# Patient Record
Sex: Female | Born: 1945 | Race: Black or African American | Hispanic: No | Marital: Single | Smoking: Never smoker
Health system: Southern US, Community
[De-identification: ages and names within clinical notes are randomized; demographics above are authoritative.]

## PROBLEM LIST (undated history)

## (undated) DIAGNOSIS — F5104 Psychophysiologic insomnia: Secondary | ICD-10-CM

## (undated) DIAGNOSIS — R946 Abnormal results of thyroid function studies: Secondary | ICD-10-CM

## (undated) DIAGNOSIS — E785 Hyperlipidemia, unspecified: Secondary | ICD-10-CM

## (undated) DIAGNOSIS — R7309 Other abnormal glucose: Secondary | ICD-10-CM

## (undated) DIAGNOSIS — E559 Vitamin D deficiency, unspecified: Secondary | ICD-10-CM

## (undated) DIAGNOSIS — Z862 Personal history of diseases of the blood and blood-forming organs and certain disorders involving the immune mechanism: Secondary | ICD-10-CM

## (undated) DIAGNOSIS — M810 Age-related osteoporosis without current pathological fracture: Secondary | ICD-10-CM

## (undated) DIAGNOSIS — G3184 Mild cognitive impairment, so stated: Secondary | ICD-10-CM

## (undated) HISTORY — DX: Personal history of diseases of the blood and blood-forming organs and certain disorders involving the immune mechanism: Z86.2

## (undated) HISTORY — DX: Hyperlipidemia, unspecified: E78.5

## (undated) HISTORY — DX: Other abnormal glucose: R73.09

## (undated) HISTORY — PX: SURGERY OF LIP: SUR1315

## (undated) HISTORY — DX: Psychophysiologic insomnia: F51.04

## (undated) HISTORY — DX: Mild cognitive impairment of uncertain or unknown etiology: G31.84

## (undated) HISTORY — DX: Age-related osteoporosis without current pathological fracture: M81.0

## (undated) HISTORY — DX: Vitamin D deficiency, unspecified: E55.9

## (undated) HISTORY — DX: Abnormal results of thyroid function studies: R94.6

---

## 2005-02-06 ENCOUNTER — Other Ambulatory Visit: Payer: Self-pay

## 2005-02-06 ENCOUNTER — Inpatient Hospital Stay: Payer: Self-pay | Admitting: Anesthesiology

## 2009-06-18 ENCOUNTER — Emergency Department: Payer: Self-pay | Admitting: Emergency Medicine

## 2009-07-20 DIAGNOSIS — Z862 Personal history of diseases of the blood and blood-forming organs and certain disorders involving the immune mechanism: Secondary | ICD-10-CM | POA: Insufficient documentation

## 2009-07-26 DIAGNOSIS — M810 Age-related osteoporosis without current pathological fracture: Secondary | ICD-10-CM | POA: Insufficient documentation

## 2009-08-28 ENCOUNTER — Ambulatory Visit: Payer: Self-pay | Admitting: Gastroenterology

## 2010-07-20 DIAGNOSIS — R946 Abnormal results of thyroid function studies: Secondary | ICD-10-CM | POA: Insufficient documentation

## 2010-07-20 DIAGNOSIS — E785 Hyperlipidemia, unspecified: Secondary | ICD-10-CM | POA: Insufficient documentation

## 2012-07-05 ENCOUNTER — Emergency Department: Payer: Self-pay | Admitting: Emergency Medicine

## 2012-07-05 LAB — URINALYSIS, COMPLETE
Bilirubin,UR: NEGATIVE
Glucose,UR: NEGATIVE mg/dL (ref 0–75)
Ketone: NEGATIVE
Leukocyte Esterase: NEGATIVE
Nitrite: NEGATIVE
Ph: 6 (ref 4.5–8.0)
Protein: NEGATIVE
RBC,UR: 1 /HPF (ref 0–5)
Specific Gravity: 1.004 (ref 1.003–1.030)
Squamous Epithelial: <1
WBC UR: 5 /HPF (ref 0–5)

## 2012-07-05 LAB — CBC
HCT: 40.1 % (ref 35.0–47.0)
HGB: 12.7 g/dL (ref 12.0–16.0)
MCH: 29.8 pg (ref 26.0–34.0)
MCHC: 31.8 g/dL — ABNORMAL LOW (ref 32.0–36.0)
MCV: 94 fL (ref 80–100)
Platelet: 258 10*3/uL (ref 150–440)
RBC: 4.27 10*6/uL (ref 3.80–5.20)
RDW: 13.2 % (ref 11.5–14.5)
WBC: 8.4 10*3/uL (ref 3.6–11.0)

## 2012-07-05 LAB — COMPREHENSIVE METABOLIC PANEL
Albumin: 3.8 g/dL (ref 3.4–5.0)
Alkaline Phosphatase: 75 U/L (ref 50–136)
Anion Gap: 8 (ref 7–16)
BUN: 9 mg/dL (ref 7–18)
Bilirubin,Total: 0.6 mg/dL (ref 0.2–1.0)
Calcium, Total: 9.1 mg/dL (ref 8.5–10.1)
Chloride: 108 mmol/L — ABNORMAL HIGH (ref 98–107)
Co2: 23 mmol/L (ref 21–32)
Creatinine: 0.75 mg/dL (ref 0.60–1.30)
EGFR (African American): >60
EGFR (Non-African Amer.): >60
Glucose: 93 mg/dL (ref 65–99)
Osmolality: 276 (ref 275–301)
Potassium: 3.8 mmol/L (ref 3.5–5.1)
SGOT(AST): 36 U/L (ref 15–37)
SGPT (ALT): 19 U/L
Sodium: 139 mmol/L (ref 136–145)
Total Protein: 8.6 g/dL — ABNORMAL HIGH (ref 6.4–8.2)

## 2012-07-05 LAB — LIPASE, BLOOD: Lipase: 76 U/L (ref 73–393)

## 2014-04-22 ENCOUNTER — Emergency Department: Payer: Self-pay | Admitting: Emergency Medicine

## 2014-04-22 LAB — PROTIME-INR
INR: 1
Prothrombin Time: 12.6 secs (ref 11.5–14.7)

## 2014-04-22 LAB — COMPREHENSIVE METABOLIC PANEL
Albumin: 3.1 g/dL — ABNORMAL LOW (ref 3.4–5.0)
Alkaline Phosphatase: 50 U/L
Anion Gap: 5 — ABNORMAL LOW (ref 7–16)
BUN: 11 mg/dL (ref 7–18)
Bilirubin,Total: 0.6 mg/dL (ref 0.2–1.0)
Calcium, Total: 7.5 mg/dL — ABNORMAL LOW (ref 8.5–10.1)
Chloride: 119 mmol/L — ABNORMAL HIGH (ref 98–107)
Co2: 20 mmol/L — ABNORMAL LOW (ref 21–32)
Creatinine: 0.84 mg/dL (ref 0.60–1.30)
EGFR (African American): >60
EGFR (Non-African Amer.): >60
Glucose: 85 mg/dL (ref 65–99)
Osmolality: 285 (ref 275–301)
Potassium: 3.2 mmol/L — ABNORMAL LOW (ref 3.5–5.1)
SGOT(AST): 32 U/L (ref 15–37)
SGPT (ALT): 13 U/L (ref 12–78)
Sodium: 144 mmol/L (ref 136–145)
Total Protein: 6.5 g/dL (ref 6.4–8.2)

## 2014-04-22 LAB — CBC
HCT: 40.9 % (ref 35.0–47.0)
HGB: 13.3 g/dL (ref 12.0–16.0)
MCH: 30.4 pg (ref 26.0–34.0)
MCHC: 32.6 g/dL (ref 32.0–36.0)
MCV: 93 fL (ref 80–100)
Platelet: 276 10*3/uL (ref 150–440)
RBC: 4.39 10*6/uL (ref 3.80–5.20)
RDW: 13.5 % (ref 11.5–14.5)
WBC: 13.4 10*3/uL — ABNORMAL HIGH (ref 3.6–11.0)

## 2014-04-22 LAB — CK TOTAL AND CKMB (NOT AT ARMC)
CK, Total: 154 U/L
CK-MB: 0.8 ng/mL (ref 0.5–3.6)

## 2014-04-22 LAB — TROPONIN I
Troponin-I: <0.02 ng/mL
Troponin-I: <0.02 ng/mL

## 2014-04-22 LAB — APTT: Activated PTT: 29.3 secs (ref 23.6–35.9)

## 2015-03-23 ENCOUNTER — Ambulatory Visit
Admit: 2015-03-23 | Disposition: A | Discharge status: Home / Self Care | Payer: Self-pay | Attending: Family Medicine | Admitting: Family Medicine

## 2015-03-23 DIAGNOSIS — M810 Age-related osteoporosis without current pathological fracture: Secondary | ICD-10-CM | POA: Diagnosis not present

## 2015-03-23 DIAGNOSIS — Z1231 Encounter for screening mammogram for malignant neoplasm of breast: Secondary | ICD-10-CM | POA: Diagnosis not present

## 2015-03-23 DIAGNOSIS — E2839 Other primary ovarian failure: Secondary | ICD-10-CM | POA: Diagnosis not present

## 2015-04-07 DIAGNOSIS — M810 Age-related osteoporosis without current pathological fracture: Secondary | ICD-10-CM | POA: Diagnosis not present

## 2015-04-07 DIAGNOSIS — M533 Sacrococcygeal disorders, not elsewhere classified: Secondary | ICD-10-CM | POA: Diagnosis not present

## 2015-06-09 ENCOUNTER — Ambulatory Visit: Payer: Self-pay | Admitting: Family Medicine

## 2015-07-24 DIAGNOSIS — M810 Age-related osteoporosis without current pathological fracture: Secondary | ICD-10-CM | POA: Diagnosis not present

## 2015-07-29 ENCOUNTER — Encounter: Payer: Self-pay | Admitting: Family Medicine

## 2015-07-29 DIAGNOSIS — M533 Sacrococcygeal disorders, not elsewhere classified: Secondary | ICD-10-CM | POA: Insufficient documentation

## 2015-07-31 ENCOUNTER — Encounter: Payer: Self-pay | Admitting: Family Medicine

## 2015-07-31 ENCOUNTER — Ambulatory Visit (INDEPENDENT_AMBULATORY_CARE_PROVIDER_SITE_OTHER): Payer: Medicare Other | Admitting: Family Medicine

## 2015-07-31 VITALS — BP 132/80 | HR 75 | Temp 98.9°F | Resp 14 | Ht 64.0 in | Wt 137.9 lb

## 2015-07-31 DIAGNOSIS — Z862 Personal history of diseases of the blood and blood-forming organs and certain disorders involving the immune mechanism: Secondary | ICD-10-CM

## 2015-07-31 DIAGNOSIS — Z79899 Other long term (current) drug therapy: Secondary | ICD-10-CM | POA: Diagnosis not present

## 2015-07-31 DIAGNOSIS — Z23 Encounter for immunization: Secondary | ICD-10-CM | POA: Diagnosis not present

## 2015-07-31 DIAGNOSIS — E785 Hyperlipidemia, unspecified: Secondary | ICD-10-CM

## 2015-07-31 DIAGNOSIS — M810 Age-related osteoporosis without current pathological fracture: Secondary | ICD-10-CM | POA: Diagnosis not present

## 2015-07-31 DIAGNOSIS — R946 Abnormal results of thyroid function studies: Secondary | ICD-10-CM | POA: Diagnosis not present

## 2015-07-31 MED ORDER — ZOSTER VACCINE LIVE 19400 UNT/0.65ML ~~LOC~~ SOLR: 0.6500 mL | Freq: Once | SUBCUTANEOUS | Status: DC

## 2015-07-31 NOTE — Progress Notes (Signed)
Name: Lauren Eaton   MRN: [id number]    DOB: 11/04/1946   Date:07/31/2015       Progress Note  Subjective  Chief Complaint  Chief Complaint  Patient presents with  . Osteoporosis    just started fosamax    HPI  Osteoporosis: seen by Dr. Gavin Potters last week, and was started on Fosamax, but TSH was elevated and needs to have more labs  Hyperlipidemia: on diet only, she avoids fried food, she exercise daily for an hour at [employer], on TV for senior citizens  Elevated TSH: she also had it elevated in the past, but fractions were normal, last one done by Dr. Gavin Potters was above 7, but she denies constipation, fatigue, dry skin, or hair loss.  Patient Active Problem List   Diagnosis Date Noted  . 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 Abnormal finding on thyroid function test 07/20/2010  . Dyslipidemia 07/20/2010  . OP (osteoporosis) 07/26/2009  . History of anemia 07/20/2009    History reviewed. No pertinent past surgical history.  History reviewed. No pertinent family history.  Social History   Social History  . Marital Status: Single    Spouse Name: N/A  . Number of Children: N/A  . Years of Education: N/A   Occupational History  . Not on file.   Social History Main Topics  . Smoking status: Never Smoker   . Smokeless tobacco: Never Used  . Alcohol Use: No  . Drug Use: No  . Sexual Activity: Not Currently   Other Topics Concern  . Not on file   Social History Narrative  . No narrative on file     Current outpatient prescriptions:  .  alendronate (FOSAMAX) 70 MG tablet, Take 1 tablet by mouth once a week., Disp: , Rfl: 0 .  calcium-vitamin D (SM CALCIUM 500/VITAMIN D3) 500-400 MG-UNIT per tablet, Take 1 tablet by mouth daily., Disp: , Rfl:  .  Multiple Vitamin (MULTI-VITAMINS) TABS, Take 1 tablet by mouth daily., Disp: , Rfl:   Not on File   ROS  Constitutional: Negative for fever mild  weight change.  Respiratory: Negative for cough and shortness of breath.    Cardiovascular: Negative for chest pain or palpitations.  Gastrointestinal: Negative for abdominal pain, no bowel changes.  Musculoskeletal: Negative for gait problem or joint swelling.  Skin: Negative for rash.  Neurological: Negative for dizziness or headache.  No other specific complaints in a complete review of systems (except as listed in HPI above).  Objective  Filed Vitals:   07/31/15 1613  BP: 132/80  Pulse: 75  Temp: 98.9 F (37.2 C)  TempSrc: Oral  Resp: 14  Height: 5\' 4"  (1.626 m)  Weight: 137 lb 14.4 oz (62.551 kg)  SpO2: 97%    Body mass index is 23.66 kg/(m^2).  Physical Exam  Constitutional: Patient appears well-developed and well-nourished.  No distress.  HEENT: head atraumatic, normocephalic, pupils equal and reactive to light,  neck supple, throat within normal limits Cardiovascular: Normal rate, regular rhythm and normal heart sounds.  No murmur heard. No BLE edema. Pulmonary/Chest: Effort normal and breath sounds normal. No respiratory distress. Abdominal: Soft.  There is no tenderness. Psychiatric: Patient has a normal mood and affect. behavior is normal. Judgment and thought content normal.    PHQ2/9: Depression screen PHQ 2/9 07/31/2015  Decreased Interest 0  Down, Depressed, Hopeless 0  PHQ - 2 Score 0    Fall Risk: Fall Risk  07/31/2015  Falls in the past year? No  Assessment & Plan  1. Dyslipidemia  - Lipid panel  2. OP (osteoporosis)  - Vit D  25 hydroxy (rtn osteoporosis monitoring)  3. History of anemia  - CBC with Differential/Platelet  4. Abnormal finding on thyroid function test  - Thyroid Panel With TSH  5. Long-term use of high-risk medication  - Comprehensive metabolic panel  6. Need for shingles vaccine  - Varicella-zoster vaccine subcutaneous  7. Need for pneumococcal vaccination  - Pneumococcal polysaccharide vaccine 23-valent greater than or equal to 2yo subcutaneous/IM  8. Needs flu shot  - Flu  Vaccine QUAD 36+ mos IM

## 2015-08-01 DIAGNOSIS — Z79899 Other long term (current) drug therapy: Secondary | ICD-10-CM | POA: Diagnosis not present

## 2015-08-01 DIAGNOSIS — E785 Hyperlipidemia, unspecified: Secondary | ICD-10-CM | POA: Diagnosis not present

## 2015-08-01 DIAGNOSIS — R946 Abnormal results of thyroid function studies: Secondary | ICD-10-CM | POA: Diagnosis not present

## 2015-08-01 DIAGNOSIS — M810 Age-related osteoporosis without current pathological fracture: Secondary | ICD-10-CM | POA: Diagnosis not present

## 2015-08-01 DIAGNOSIS — Z862 Personal history of diseases of the blood and blood-forming organs and certain disorders involving the immune mechanism: Secondary | ICD-10-CM | POA: Diagnosis not present

## 2015-08-02 LAB — CBC WITH DIFFERENTIAL/PLATELET
Basophils Absolute: 0 10*3/uL (ref 0.0–0.2)
Basos: 0 %
EOS (ABSOLUTE): 0.1 10*3/uL (ref 0.0–0.4)
Eos: 1 %
Hematocrit: 35.6 % (ref 34.0–46.6)
Hemoglobin: 11.9 g/dL (ref 11.1–15.9)
Immature Grans (Abs): 0 10*3/uL (ref 0.0–0.1)
Immature Granulocytes: 0 %
Lymphocytes Absolute: 1.7 10*3/uL (ref 0.7–3.1)
Lymphs: 20 %
MCH: 30.3 pg (ref 26.6–33.0)
MCHC: 33.4 g/dL (ref 31.5–35.7)
MCV: 91 fL (ref 79–97)
Monocytes Absolute: 0.5 10*3/uL (ref 0.1–0.9)
Monocytes: 6 %
Neutrophils Absolute: 6.1 10*3/uL (ref 1.4–7.0)
Neutrophils: 73 %
Platelets: 277 10*3/uL (ref 150–379)
RBC: 3.93 x10E6/uL (ref 3.77–5.28)
RDW: 14.2 % (ref 12.3–15.4)
WBC: 8.5 10*3/uL (ref 3.4–10.8)

## 2015-08-02 LAB — THYROID PANEL WITH TSH
Free Thyroxine Index: 1.9 (ref 1.2–4.9)
T4, Total: 7.4 ug/dL (ref 4.5–12.0)

## 2015-08-02 LAB — COMPREHENSIVE METABOLIC PANEL
ALT: 14 IU/L (ref 0–32)
AST: 20 IU/L (ref 0–40)
Albumin/Globulin Ratio: 1.6 (ref 1.1–2.5)
Albumin: 4.3 g/dL (ref 3.6–4.8)
Alkaline Phosphatase: 70 IU/L (ref 39–117)
BUN/Creatinine Ratio: 10 — ABNORMAL LOW (ref 11–26)
BUN: 8 mg/dL (ref 8–27)
Bilirubin Total: 0.6 mg/dL (ref 0.0–1.2)
CO2: 24 mmol/L (ref 18–29)
Calcium: 9.5 mg/dL (ref 8.7–10.3)
Chloride: 103 mmol/L (ref 97–108)
Creatinine, Ser: 0.79 mg/dL (ref 0.57–1.00)
GFR calc Af Amer: 90 mL/min/{1.73_m2} (ref >59)
GFR calc non Af Amer: 78 mL/min/{1.73_m2} (ref >59)
Globulin, Total: 2.7 g/dL (ref 1.5–4.5)
Glucose: 88 mg/dL (ref 65–99)
Potassium: 4.9 mmol/L (ref 3.5–5.2)
Sodium: 143 mmol/L (ref 134–144)
Total Protein: 7 g/dL (ref 6.0–8.5)

## 2015-08-02 LAB — LIPID PANEL
Chol/HDL Ratio: 5.3 ratio units — ABNORMAL HIGH (ref 0.0–4.4)
Cholesterol, Total: 208 mg/dL — ABNORMAL HIGH (ref 100–199)
HDL: 39 mg/dL — ABNORMAL LOW (ref >39)
LDL Calculated: 128 mg/dL — ABNORMAL HIGH (ref 0–99)
Triglycerides: 203 mg/dL — ABNORMAL HIGH (ref 0–149)
VLDL Cholesterol Cal: 41 mg/dL — ABNORMAL HIGH (ref 5–40)

## 2015-08-02 LAB — VITAMIN D 25 HYDROXY (VIT D DEFICIENCY, FRACTURES): Vit D, 25-Hydroxy: 33.8 ng/mL (ref 30.0–100.0)

## 2015-08-03 ENCOUNTER — Telehealth: Payer: Self-pay

## 2015-08-03 MED ORDER — ATORVASTATIN CALCIUM 40 MG PO TABS: 40.0000 mg | ORAL_TABLET | Freq: Every day | ORAL | Status: DC

## 2015-08-03 NOTE — Telephone Encounter (Signed)
-----   Message from [name], MD sent at 08/03/2015  2:03 PM EDT ----- Thyroid fractions within normal limits TSH is pending Lipid panel is not normal, and she would benefit from statin therapy - I can send prescription if she is willing to take medication Lipid panel shows low HDL : to improve HDL patient  needs to eat tree nuts ( pecans/pistachios/almonds ) four times weekly, eat fish two times weekly  and exercise  at least 150 minutes per week CBC is normal, WBC back to normal  Sugar , kidney and liver functions are within normal limits Vitamin D also within normal limits

## 2015-08-03 NOTE — Telephone Encounter (Signed)
Patient notified of labs and states she would start a statin therapy for her cholesterol. Please send medication into [facility] on [address]. Please.

## 2016-01-18 DIAGNOSIS — K1329 Other disturbances of oral epithelium, including tongue: Secondary | ICD-10-CM | POA: Diagnosis not present

## 2016-01-23 DIAGNOSIS — K1329 Other disturbances of oral epithelium, including tongue: Secondary | ICD-10-CM | POA: Diagnosis not present

## 2016-02-13 ENCOUNTER — Encounter: Payer: Self-pay | Admitting: Family Medicine

## 2016-02-13 ENCOUNTER — Ambulatory Visit (INDEPENDENT_AMBULATORY_CARE_PROVIDER_SITE_OTHER): Payer: Medicare Other | Admitting: Family Medicine

## 2016-02-13 VITALS — BP 138/82 | HR 75 | Temp 98.2°F | Resp 12 | Ht 64.0 in | Wt 138.9 lb

## 2016-02-13 DIAGNOSIS — Z1239 Encounter for other screening for malignant neoplasm of breast: Secondary | ICD-10-CM

## 2016-02-13 DIAGNOSIS — E785 Hyperlipidemia, unspecified: Secondary | ICD-10-CM

## 2016-02-13 DIAGNOSIS — Z0000 Encounter for general adult medical examination without abnormal findings: Secondary | ICD-10-CM

## 2016-02-13 DIAGNOSIS — Z124 Encounter for screening for malignant neoplasm of cervix: Secondary | ICD-10-CM

## 2016-02-13 DIAGNOSIS — M533 Sacrococcygeal disorders, not elsewhere classified: Secondary | ICD-10-CM | POA: Diagnosis not present

## 2016-02-13 DIAGNOSIS — M810 Age-related osteoporosis without current pathological fracture: Secondary | ICD-10-CM | POA: Diagnosis not present

## 2016-02-13 MED ORDER — ALENDRONATE SODIUM 70 MG PO TABS
70.0000 mg | ORAL_TABLET | ORAL | Status: DC
Start: 2016-02-13 — End: 2017-01-27

## 2016-02-13 MED ORDER — ATORVASTATIN CALCIUM 40 MG PO TABS: 40.0000 mg | ORAL_TABLET | Freq: Every day | ORAL | Status: DC

## 2016-02-13 MED ORDER — ASPIRIN EC 81 MG PO TBEC: 81.0000 mg | DELAYED_RELEASE_TABLET | Freq: Every day | ORAL | Status: AC

## 2016-02-13 NOTE — Progress Notes (Signed)
Name: Lauren Eaton   MRN: [id number]    DOB: [birth year]   Date:02/13/2016       Progress Note  Subjective  Chief Complaint  Chief Complaint  Patient presents with  . Annual Exam  . Orders    mammogram    HPI  Osteoporosis: seen by Dr. Gavin Potters n August 2016, and was started on Fosamax, but she states she did not get any refills and is out of medication. She took almost two years of Forteo back in 2012 and 2013. Last bone density was 03/2015 and still showed osteoporosis. She is taking otc supplementations. Last vitamin D and kidney function also TSH were normal in August 2016  Hyperlipidemia: on diet only, she avoids fried food, she exercise daily for an hour at [employer]  on TV for senior citizens- she will switch from peanuts to almonds/ or any other tree nut.  Coccygeal pain: she is doing much bette, she uses a soft pillow to sit on while watching TV.   Functional ability/safety issues: No Issues Hearing issues: Addressed  Activities of daily living: Discussed Home safety issues: No Issues  End Of Life Planning: Offered verbal information regarding advanced directives, healthcare power of attorney.  Preventative care, Health maintenance, Preventative health measures discussed.  Preventative screenings discussed today: lab work, colonoscopy, PAP, mammogram, DEXA.  Low Dose CT Chest recommended if Age 16-80 years, 30 pack-year currently smoking OR have quit w/in 15years.   Lifestyle risk factor issued reviewed: Diet, exercise, weight management, advised patient smoking is not healthy, nutrition/diet.  Preventative health measures discussed (5-10 year plan).  Reviewed and recommended vaccinations: - Pneumovax  - Prevnar  - Annual Influenza - Zostavax - Tdap   Depression screening: Done Fall risk screening: Done Discuss ADLs/IADLs: Done  Current medical providers: See HPI  Other health risk factors identified this visit: No other issues Cognitive impairment  issues: None identified  All above discussed with patient. Appropriate education, counseling and referral will be made based upon the above.   Patient Active Problem List   Diagnosis Date Noted  . 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 Dyslipidemia 07/20/2010  . OP (osteoporosis) 07/26/2009  . History of anemia 07/20/2009    Past Surgical History  Procedure Laterality Date  . Surgery of lip      Family History  Problem Relation Age of Onset  . Alzheimer's disease Mother   . Dementia Mother   . Early death Father     MVA  . CVA Sister 60    complications of  . Cancer Brother     stomach  . Cancer Brother     lung    Social History   Social History  . Marital Status: Single    Spouse Name: N/A  . Number of Children: 1  . Years of Education: N/A   Occupational History  . 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 Smoking status: Never Smoker   . Smokeless tobacco: Never Used     Comment: Used snuff, dips 3-4 dips/day, has used it 40-50 years. quit 10/2014.  Marland Kitchen Alcohol Use: No  . Drug Use: No  . Sexual Activity: Not Currently   Other Topics Concern  . Not on file   Social History Narrative     Current outpatient prescriptions:  .  atorvastatin (LIPITOR) 40 MG tablet, Take 1 tablet (40 mg total) by mouth daily., Disp: 90 tablet, Rfl: 1 .  calcium-vitamin D (SM CALCIUM  500/VITAMIN D3) 500-400 MG-UNIT per tablet, Take 1 tablet by mouth daily., Disp: , Rfl:  .  Multiple Vitamin (MULTI-VITAMINS) TABS, Take 1 tablet by mouth daily., Disp: , Rfl:  .  acetaminophen-codeine (TYLENOL #3) 300-30 MG tablet, Reported on 02/13/2016, Disp: , Rfl: 0 .  alendronate (FOSAMAX) 70 MG tablet, Take 1 tablet by mouth once a week. Reported on 02/13/2016, Disp: , Rfl: 0  No Known Allergies   ROS  Constitutional: Negative for fever or weight change.  Respiratory: Negative for cough and shortness of breath.   Cardiovascular: Negative for chest pain or  palpitations.  Gastrointestinal: Negative for abdominal pain, no bowel changes.  Musculoskeletal: Negative for gait problem or joint swelling.  Skin: Negative for rash.  Neurological: Negative for dizziness or headache.  No other specific complaints in a complete review of systems (except as listed in HPI above).   Objective  Filed Vitals:   02/13/16 1359  BP: 138/82  Pulse: 75  Temp: 98.2 F (36.8 C)  TempSrc: Oral  Resp: 12  Height:  (1.626 m)  Weight: 138 lb 14.4 oz (63.005 kg)  SpO2: 98%    Body mass index is 23.83 kg/(m^2).  Physical Exam  Constitutional: Patient appears well-developed and well-nourished. No distress.  HENT: Head: Normocephalic and atraumatic. Ears: B TMs ok, no erythema or effusion; Nose: Nose normal. Mouth/Throat: Oropharynx is clear and moist. No oropharyngeal exudate.  Eyes: Conjunctivae and EOM are normal. Pupils are equal, round, and reactive to light. No scleral icterus.  Neck: Normal range of motion. Neck supple. No JVD present. No thyromegaly present.  Cardiovascular: Normal rate, regular rhythm and normal heart sounds.  No murmur heard. No BLE edema. Pulmonary/Chest: Effort normal and breath sounds normal. No respiratory distress. Abdominal: Soft. Bowel sounds are normal, no distension. There is no tenderness. no masses Breast: no lumps or masses, no nipple discharge or rashes FEMALE GENITALIA:  External genitalia normal External urethra normal Vaginal vault normal without discharge or lesions Cervix normal without discharge or lesions Bimanual exam normal without masses RECTAL: not done Musculoskeletal: Normal range of motion, no joint effusions. No gross deformities Neurological: he is alert and oriented to person, place, and time. No cranial nerve deficit. Coordination, balance, strength, speech and gait are normal.  Skin: Skin is warm and dry. No rash noted. No erythema.  Psychiatric: Patient has a normal mood and affect. behavior  is normal. Judgment and thought content normal.  PHQ2/9: Depression screen [facility] 2/9 02/13/2016 07/31/2015  Decreased Interest 0 0  Down, Depressed, Hopeless 0 0  PHQ - 2 Score 0 0    Fall Risk: Fall Risk  02/13/2016 07/31/2015  Falls in the past year? No No    Functional Status Survey: Is the patient deaf or have difficulty hearing?: No Does the patient have difficulty seeing, even when wearing glasses/contacts?: No Does the patient have difficulty concentrating, remembering, or making decisions?: No Does the patient have difficulty walking or climbing stairs?: No Does the patient have difficulty dressing or bathing?: No Does the patient have difficulty doing errands alone such as visiting a doctor's office or shopping?: No    Assessment & Plan  1. Medicare annual wellness visit, subsequent  Discussed importance of 150 minutes of physical activity weekly, eat two servings of fish weekly, eat one serving of tree nuts ( cashews, pistachios, pecans, almonds.Marland Kitchen) every other day, eat 6 servings of fruit/vegetables daily and drink plenty of water and avoid sweet beverages.   2. Dyslipidemia  Discussed  ways to increase HDL cholesterol, continue statin therapy, recheck labs in 6 months - aspirin EC 81 MG tablet; Take 1 tablet (81 mg total) by mouth daily.  Dispense: 30 tablet; Refill: 0 - atorvastatin (LIPITOR) 40 MG tablet; Take 1 tablet (40 mg total) by mouth daily.  Dispense: 90 tablet; Refill: 1   3. OP (osteoporosis)  Needs to resume Fosamax - alendronate (FOSAMAX) 70 MG tablet; Take 1 tablet (70 mg total) by mouth once a week. Reported on 02/13/2016  Dispense: 4 tablet; Refill: 12  4. Coccyalgia  Improved.   5. Breast cancer screening  - MM Digital Screening; Future  6. Cervical cancer screening  - Pap IG and HPV (high risk) DNA detection

## 2016-02-14 DIAGNOSIS — Z124 Encounter for screening for malignant neoplasm of cervix: Secondary | ICD-10-CM | POA: Diagnosis not present

## 2016-02-14 DIAGNOSIS — Z1151 Encounter for screening for human papillomavirus (HPV): Secondary | ICD-10-CM | POA: Diagnosis not present

## 2016-02-17 LAB — PAP IG AND HPV HIGH-RISK
HPV, high-risk: NEGATIVE
PAP Smear Comment: 0

## 2016-07-24 ENCOUNTER — Other Ambulatory Visit: Payer: Self-pay | Admitting: Pharmacist

## 2016-07-24 NOTE — Patient Outreach (Signed)
Outreach call to Hess CorporationMaxine Eaton regarding her request for follow up from the [facility]. Left a HIPAA compliant message on the patient's voicemail.  Duanne MoronElisabeth Shevelle Smither, PharmD Clinical Pharmacist [facility] [phone]

## 2016-08-09 ENCOUNTER — Other Ambulatory Visit: Payer: Self-pay | Admitting: Family Medicine

## 2016-08-09 ENCOUNTER — Encounter: Payer: Self-pay | Admitting: Family Medicine

## 2016-08-09 ENCOUNTER — Ambulatory Visit (INDEPENDENT_AMBULATORY_CARE_PROVIDER_SITE_OTHER): Payer: Medicare Other | Admitting: Family Medicine

## 2016-08-09 VITALS — BP 118/74 | HR 78 | Temp 98.4°F | Resp 16 | Ht 64.0 in | Wt 138.2 lb

## 2016-08-09 DIAGNOSIS — Z862 Personal history of diseases of the blood and blood-forming organs and certain disorders involving the immune mechanism: Secondary | ICD-10-CM | POA: Diagnosis not present

## 2016-08-09 DIAGNOSIS — R4689 Other symptoms and signs involving appearance and behavior: Secondary | ICD-10-CM | POA: Insufficient documentation

## 2016-08-09 DIAGNOSIS — M810 Age-related osteoporosis without current pathological fracture: Secondary | ICD-10-CM

## 2016-08-09 DIAGNOSIS — E785 Hyperlipidemia, unspecified: Secondary | ICD-10-CM | POA: Diagnosis not present

## 2016-08-09 DIAGNOSIS — R413 Other amnesia: Secondary | ICD-10-CM | POA: Insufficient documentation

## 2016-08-09 DIAGNOSIS — G3184 Mild cognitive impairment, so stated: Secondary | ICD-10-CM | POA: Diagnosis not present

## 2016-08-09 DIAGNOSIS — Z79899 Other long term (current) drug therapy: Secondary | ICD-10-CM

## 2016-08-09 DIAGNOSIS — F919 Conduct disorder, unspecified: Secondary | ICD-10-CM | POA: Diagnosis not present

## 2016-08-09 LAB — CBC WITH DIFFERENTIAL/PLATELET
Basophils Absolute: 0 cells/uL (ref 0–200)
Basophils Relative: 0 %
Eosinophils Absolute: 130 cells/uL (ref 15–500)
Eosinophils Relative: 2 %
HCT: 37.7 % (ref 35.0–45.0)
Hemoglobin: 12.6 g/dL (ref 11.7–15.5)
Lymphocytes Relative: 36 %
Lymphs Abs: 2340 cells/uL (ref 850–3900)
MCH: 30.4 pg (ref 27.0–33.0)
MCHC: 33.4 g/dL (ref 32.0–36.0)
MCV: 90.8 fL (ref 80.0–100.0)
MPV: 9.2 fL (ref 7.5–12.5)
Monocytes Absolute: 390 cells/uL (ref 200–950)
Monocytes Relative: 6 %
Neutro Abs: 3640 cells/uL (ref 1500–7800)
Neutrophils Relative %: 56 %
Platelets: 266 10*3/uL (ref 140–400)
RBC: 4.15 MIL/uL (ref 3.80–5.10)
RDW: 13.4 % (ref 11.0–15.0)
WBC: 6.5 10*3/uL (ref 3.8–10.8)

## 2016-08-09 LAB — LIPID PANEL
Cholesterol: 114 mg/dL — ABNORMAL LOW (ref 125–200)
HDL: 56 mg/dL (ref >=46)
LDL Cholesterol: 43 mg/dL (ref <130)
Total CHOL/HDL Ratio: 2 Ratio (ref <=5.0)
Triglycerides: 74 mg/dL (ref <150)
VLDL: 15 mg/dL (ref <30)

## 2016-08-09 LAB — COMPLETE METABOLIC PANEL WITH GFR
ALT: 24 U/L (ref 6–29)
AST: 35 U/L (ref 10–35)
Albumin: 4.4 g/dL (ref 3.6–5.1)
Alkaline Phosphatase: 113 U/L (ref 33–130)
BUN: 9 mg/dL (ref 7–25)
CO2: 28 mmol/L (ref 20–31)
Calcium: 9.7 mg/dL (ref 8.6–10.4)
Chloride: 105 mmol/L (ref 98–110)
Creat: 0.84 mg/dL (ref 0.50–0.99)
GFR, Est African American: 83 mL/min (ref >=60)
GFR, Est Non African American: 72 mL/min (ref >=60)
Glucose, Bld: 76 mg/dL (ref 65–99)
Potassium: 4.4 mmol/L (ref 3.5–5.3)
Sodium: 142 mmol/L (ref 135–146)
Total Bilirubin: 0.9 mg/dL (ref 0.2–1.2)
Total Protein: 7.6 g/dL (ref 6.1–8.1)

## 2016-08-09 LAB — TSH: TSH: 6.18 mIU/L — ABNORMAL HIGH

## 2016-08-09 LAB — VITAMIN B12: Vitamin B-12: 735 pg/mL (ref 200–1100)

## 2016-08-09 MED ORDER — ATORVASTATIN CALCIUM 40 MG PO TABS: 40.0000 mg | ORAL_TABLET | Freq: Every day | ORAL | 1 refills | Status: DC

## 2016-08-09 MED ORDER — DONEPEZIL HCL 5 MG PO TABS: 5.0000 mg | ORAL_TABLET | Freq: Every day | ORAL | 0 refills | Status: DC

## 2016-08-09 NOTE — Progress Notes (Signed)
Name: Lauren Eaton   MRN: [id number]    DOB: Mar 05, 1946   Date:08/09/2016       Progress Note  Subjective  Chief Complaint  Chief Complaint  Patient presents with  . Memory Loss    Patient daughter is concern about mother's memory is fading, Onset- the past year and getting worst.     HPI  Osteoporosis: seen by Dr. Gavin Potters n August 2016, and was started on Fosamax, she had a gap, but has been taking it weekly since Feb 2017. She took almost two years of Forteo back in 2012 and 2013. Last bone density was 03/2015 and still showed osteoporosis. She is taking otc supplementations. Last vitamin D and kidney function also TSH were normal in August 2016  Hyperlipidemia: on stating therapy,  she avoids fried food, she exercise daily for an hour at [employer]  on TV for senior citizens- she has not switched from peanuts to tree nuts. She is due for labs  Memory loss/change in behavior: daughter came in with her today and states she has been forgetful ( asking same question) but not re-telling stories. Getting paranoid about her great-great-great- grandaughter. She is very worried about baby being cold, she is calling relatives almost daily because she is worried. She is concerned about the baby. She has never gotten lost. MMS today was 28. Positive family history of Alzheimer's - mother, also has a sister with symptoms but not formally diagnosed with dementia.  Patient Active Problem List   Diagnosis Date Noted  . Behavioral change 08/09/2016  . Memory changes 08/09/2016  . 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 Dyslipidemia 07/20/2010  . OP (osteoporosis) 07/26/2009  . History of anemia 07/20/2009    Past Surgical History:  Procedure Laterality Date  . SURGERY OF LIP      Family History  Problem Relation Age of Onset  . Alzheimer's disease Mother   . Dementia Mother   . Early death Father     MVA  . CVA Sister 16    complications of  . Cancer Brother     stomach  . Cancer Brother    lung    Social History   Social History  . Marital status: Single    Spouse name: N/A  . Number of children: 1  . Years of education: N/A   Occupational History  . 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 Smoking status: Never Smoker  . Smokeless tobacco: Never Used     Comment: Used snuff, dips 3-4 dips/day, has used it 40-50 years. quit 10/2014.  Marland Kitchen Alcohol use No  . Drug use: No  . Sexual activity: Not Currently   Other Topics Concern  . Not on file   Social History Narrative   Lives alone in a senior citizen home, has friend and likes to take care of her plants and go out with her friend/neighbors. Still able to drive - but only grocery stores and church.      Current Outpatient Prescriptions:  .  alendronate (FOSAMAX) 70 MG tablet, Take 1 tablet (70 mg total) by mouth once a week. Reported on 02/13/2016, Disp: 4 tablet, Rfl: 12 .  aspirin EC 81 MG tablet, Take 1 tablet (81 mg total) by mouth daily., Disp: 30 tablet, Rfl: 0 .  atorvastatin (LIPITOR) 40 MG tablet, Take 1 tablet (40 mg total) by mouth daily., Disp: 90 tablet, Rfl: 1 .  calcium-vitamin D (SM CALCIUM 500/VITAMIN D3)  500-400 MG-UNIT per tablet, Take 1 tablet by mouth daily., Disp: , Rfl:  .  Multiple Vitamin (MULTI-VITAMINS) TABS, Take 1 tablet by mouth daily., Disp: , Rfl:   No Known Allergies   ROS  Constitutional: Negative for fever or weight change.  Respiratory: Negative for cough and shortness of breath.   Cardiovascular: Negative for chest pain or palpitations.  Gastrointestinal: Negative for abdominal pain, no bowel changes.  Musculoskeletal: Negative for gait problem or joint swelling.  Skin: Negative for rash.  Neurological: Negative for dizziness or headache.  No other specific complaints in a complete review of systems (except as listed in HPI above).  Objective  Vitals:   08/09/16 0933  BP: 118/74  Pulse: 78  Resp: 16  Temp: 98.4 F (36.9 C)   TempSrc: Oral  SpO2: 96%  Weight: 138 lb 3.2 oz (62.7 kg)  Height: 5\' 4"  (1.626 m)    Body mass index is 23.72 kg/m.  Physical Exam  Constitutional: Patient appears well-developed and well-nourished. Obese  No distress.  HEENT: head atraumatic, normocephalic, pupils equal and reactive to light, neck supple, throat within normal limits Cardiovascular: Normal rate, regular rhythm and normal heart sounds.  No murmur heard. No BLE edema. Pulmonary/Chest: Effort normal and breath sounds normal. No respiratory distress. Abdominal: Soft.  There is no tenderness. Psychiatric: Patient has a normal mood and affect. behavior is normal. Judgment and thought content normal.  PHQ2/9: Depression screen [facility] 2/9 08/09/2016 02/13/2016 07/31/2015  Decreased Interest 0 0 0  Down, Depressed, Hopeless 0 0 0  PHQ - 2 Score 0 0 0     Fall Risk: Fall Risk  08/09/2016 02/13/2016 07/31/2015  Falls in the past year? No No No    MMSE - Mini Mental State Exam 08/09/2016 08/09/2016  Orientation to time - 5  Orientation to Place - 5  Registration - 3  Attention/ Calculation - 4  Recall - 3  Language- name 2 objects - 2  Language- repeat - 1  Language- follow 3 step command - 3  Language- read & follow direction - 1  Write a sentence - 1  Copy design 0 (No Data)  Copy design-comments - 0  Result 28  Functional Status Survey: Is the patient deaf or have difficulty hearing?: No Does the patient have difficulty seeing, even when wearing glasses/contacts?: No Does the patient have difficulty concentrating, remembering, or making decisions?: Yes Does the patient have difficulty walking or climbing stairs?: No Does the patient have difficulty dressing or bathing?: No Does the patient have difficulty doing errands alone such as visiting a doctor's office or shopping?: No    Assessment & Plan  1. Dyslipidemia  - atorvastatin (LIPITOR) 40 MG tablet; Take 1 tablet (40 mg total) by mouth daily.  Dispense:  90 tablet; Refill: 1 - Lipid panel  2. Behavioral change  - TSH - Vitamin B12 - RPR - COMPLETE METABOLIC PANEL WITH GFR  3. History of anemia  - CBC with Differential/Platelet  4. Mild Cognitive impairment  - Vitamin B12 - RPR - COMPLETE METABOLIC PANEL WITH GFR Normal MMS- but a change in behavior and also memory, positive family history , discussed options, we will check labs and start low dose Aricept, follow up in 3 months or sooner if needed. Monitor for behavior changes  5. OP (osteoporosis)  Continue medication   6. Long-term use of high-risk medication  - COMPLETE METABOLIC PANEL WITH GFR

## 2016-08-10 LAB — RPR

## 2016-08-11 ENCOUNTER — Other Ambulatory Visit: Payer: Self-pay | Admitting: Family Medicine

## 2016-08-11 DIAGNOSIS — R7989 Other specified abnormal findings of blood chemistry: Secondary | ICD-10-CM

## 2016-08-12 LAB — THYROID PROFILE - CHCC
Free Thyroxine Index: 2.1 (ref 1.4–3.8)
T3 Uptake: 27 % (ref 22–35)
T4, Total: 7.6 ug/dL (ref 4.5–12.0)

## 2016-08-13 ENCOUNTER — Encounter: Payer: Self-pay | Admitting: Family Medicine

## 2016-09-23 ENCOUNTER — Ambulatory Visit: Payer: Medicare Other | Admitting: Family Medicine

## 2016-11-06 ENCOUNTER — Ambulatory Visit: Payer: Medicare Other | Admitting: Family Medicine

## 2016-11-15 ENCOUNTER — Ambulatory Visit: Payer: Medicare Other | Admitting: Family Medicine

## 2017-01-27 ENCOUNTER — Ambulatory Visit (INDEPENDENT_AMBULATORY_CARE_PROVIDER_SITE_OTHER): Payer: Medicare Other | Admitting: Family Medicine

## 2017-01-27 ENCOUNTER — Encounter: Payer: Self-pay | Admitting: Family Medicine

## 2017-01-27 VITALS — BP 138/78 | HR 74 | Temp 99.3°F | Resp 16 | Ht 64.0 in | Wt 128.3 lb

## 2017-01-27 DIAGNOSIS — G4709 Other insomnia: Secondary | ICD-10-CM

## 2017-01-27 DIAGNOSIS — M533 Sacrococcygeal disorders, not elsewhere classified: Secondary | ICD-10-CM | POA: Diagnosis not present

## 2017-01-27 DIAGNOSIS — G3184 Mild cognitive impairment, so stated: Secondary | ICD-10-CM | POA: Diagnosis not present

## 2017-01-27 DIAGNOSIS — E785 Hyperlipidemia, unspecified: Secondary | ICD-10-CM | POA: Diagnosis not present

## 2017-01-27 DIAGNOSIS — Z23 Encounter for immunization: Secondary | ICD-10-CM | POA: Diagnosis not present

## 2017-01-27 DIAGNOSIS — R634 Abnormal weight loss: Secondary | ICD-10-CM

## 2017-01-27 DIAGNOSIS — R4689 Other symptoms and signs involving appearance and behavior: Secondary | ICD-10-CM

## 2017-01-27 DIAGNOSIS — M810 Age-related osteoporosis without current pathological fracture: Secondary | ICD-10-CM

## 2017-01-27 MED ORDER — TRAZODONE HCL 50 MG PO TABS: 25.0000 mg | ORAL_TABLET | Freq: Every evening | ORAL | 1 refills | Status: DC | PRN

## 2017-01-27 MED ORDER — ALENDRONATE SODIUM 70 MG PO TABS
70.0000 mg | ORAL_TABLET | ORAL | 12 refills | Status: DC
Start: 2017-01-27 — End: 2018-02-11

## 2017-01-27 MED ORDER — DONEPEZIL HCL 5 MG PO TABS: 5.0000 mg | ORAL_TABLET | Freq: Every day | ORAL | 1 refills | Status: DC

## 2017-01-27 MED ORDER — ATORVASTATIN CALCIUM 40 MG PO TABS: 40.0000 mg | ORAL_TABLET | Freq: Every day | ORAL | 1 refills | Status: DC

## 2017-01-27 NOTE — Progress Notes (Signed)
Name: Lauren Eaton   MRN: [id number]    DOB: 1946-07-29   Date:01/27/2017       Progress Note  Subjective  Chief Complaint  Chief Complaint  Patient presents with  . Insomnia    for a while having trouble falling asleep  . Pneumonia    need for vaccine    HPI   Osteoporosis: seen by Dr. Gavin Potters n August 2016, and was started on Fosamax, she had a gap, but has been taking it weekly since Feb 2017. She took almost two years of Forteo back in 2012 and 2013. Last bone density was 03/2015 and still showed osteoporosis. She is taking otc supplementations. We will recheck bone density this year  Hyperlipidemia: on stating therapy,  she avoids fried food, she still exercises and has been busier taking care of her 55 month old great-grandson  Memory loss/change in behavior: daughter came in Summer 2017 with her today and states she has been forgetful ( asking same question) but not re-telling stories. She has never gotten lost. MMS was 29 at times of complaints. Positive family history of Alzheimer's - mother, also has a sister with symptoms but not formally diagnosed with dementia. She states ran out of Aricept, but is doing better since she started to care for her 36 month old great-grandson. She has lost 10 lbs since last visit, but states she does not eat as much because she is busy taking care of her great-grandson.   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 Behavioral change 08/09/2016  . Memory changes 08/09/2016  . 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 Dyslipidemia 07/20/2010  . OP (osteoporosis) 07/26/2009  . History of anemia 07/20/2009    Past Surgical History:  Procedure Laterality Date  . SURGERY OF LIP      Family History  Problem Relation Age of Onset  . Alzheimer's disease Mother   . Dementia Mother   . Early death Father     MVA  . CVA  Sister 31    complications of  . Cancer Brother     stomach  . Cancer Brother     lung    Social History   Social History  . Marital status: Single    Spouse name: N/A  . Number of children: 1  . Years of education: N/A   Occupational History  . 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 Smoking status: Never Smoker  . Smokeless tobacco: Never Used     Comment: Used snuff, dips 3-4 dips/day, has used it 40-50 years. quit 10/2014.  Marland Kitchen Alcohol use No  . Drug use: No  . Sexual activity: Not Currently   Other Topics Concern  . Not on file   Social History Narrative   Lives alone in a senior citizen apartment, has friends and likes to take care of her plants and go out with her friend/neighbors. Still able to drive - but only grocery stores and church. Currently watching her great-grandson - infant     Current Outpatient Prescriptions:  .  alendronate (FOSAMAX) 70 MG tablet, Take 1 tablet (70 mg total) by mouth once a week. Reported on 02/13/2016, Disp: 4 tablet, Rfl: 12 .  aspirin EC 81 MG tablet, Take 1 tablet (81 mg total) by mouth daily., Disp:  30 tablet, Rfl: 0 .  atorvastatin (LIPITOR) 40 MG tablet, Take 1 tablet (40 mg total) by mouth daily., Disp: 90 tablet, Rfl: 1 .  calcium-vitamin D (SM CALCIUM 500/VITAMIN D3) 500-400 MG-UNIT per tablet, Take 1 tablet by mouth daily., Disp: , Rfl:  .  donepezil (ARICEPT) 5 MG tablet, Take 1 tablet (5 mg total) by mouth at bedtime., Disp: 90 tablet, Rfl: 1 .  Multiple Vitamin (MULTI-VITAMINS) TABS, Take 1 tablet by mouth daily., Disp: , Rfl:  .  traZODone (DESYREL) 50 MG tablet, Take 0.5-1 tablets (25-50 mg total) by mouth at bedtime as needed for sleep., Disp: 90 tablet, Rfl: 1  No Known Allergies   ROS  Constitutional: Negative for fever, positive for  weight change ( lost 10 lbs ) .  Respiratory: Negative for cough and shortness of breath.   Cardiovascular: Negative for chest pain or  palpitations.  Gastrointestinal: Negative for abdominal pain, no bowel changes.  Musculoskeletal: Negative for gait problem or joint swelling.  Skin: Negative for rash.  Neurological: Negative for dizziness or headache.  No other specific complaints in a complete review of systems (except as listed in HPI above).  Objective  Vitals:   01/27/17 1456  BP: 138/78  Pulse: 74  Resp: 16  Temp: 99.3 F (37.4 C)  SpO2: 98%  Weight: 128 lb 5 oz (58.2 kg)  Height: 5\' 4"  (1.626 m)    Body mass index is 22.02 kg/m.  Physical Exam  Constitutional: Patient appears well-developed and well-nourished. No distress.  HEENT: head atraumatic, normocephalic, pupils equal and reactive to light,  neck supple, throat within normal limits Cardiovascular: Normal rate, regular rhythm and normal heart sounds.  No murmur heard. No BLE edema. Pulmonary/Chest: Effort normal and breath sounds normal. No respiratory distress. Abdominal: Soft.  There is no tenderness. Psychiatric: Patient has a normal mood and affect. behavior is normal. Judgment and thought content normal.  PHQ2/9: [facility] 2/9 01/27/2017 08/09/2016 02/13/2016 07/31/2015  Decreased Interest 0 0 0 0  Down, Depressed, Hopeless 0 0 0 0  PHQ - 2 Score 0 0 0 0     Fall Risk: Fall Risk  01/27/2017 08/09/2016 02/13/2016 07/31/2015  Falls in the past year? No No No No     Functional Status Survey: Is the patient deaf or have difficulty hearing?: No Does the patient have difficulty seeing, even when wearing glasses/contacts?: No Does the patient have difficulty concentrating, remembering, or making decisions?: Yes Does the patient have difficulty walking or climbing stairs?: No Does the patient have difficulty dressing or bathing?: No Does the patient have difficulty doing errands alone such as visiting a doctor's office or shopping?: No    Assessment & Plan  1. Mild cognitive impairment  She ran out of medication and has  noticed problems sleeping, we will resume Aricept and try to add Trazodone, denies hallucinations - donepezil (ARICEPT) 5 MG tablet; Take 1 tablet (5 mg total) by mouth at bedtime.  Dispense: 90 tablet; Refill: 1  2. Other insomnia  - traZODone (DESYREL) 50 MG tablet; Take 0.5-1 tablets (25-50 mg total) by mouth at bedtime as needed for sleep.  Dispense: 90 tablet; Refill: 1  3. Need for pneumococcal vaccination  - Pneumococcal conjugate vaccine 13-valent  4. Dyslipidemia  - atorvastatin (LIPITOR) 40 MG tablet; Take 1 tablet (40 mg total) by mouth daily.  Dispense: 90 tablet; Refill: 1  5. Coccyalgia  - DG Sacrum/Coccyx; Future  6. Behavioral change  - donepezil (ARICEPT) 5  MG tablet; Take 1 tablet (5 mg total) by mouth at bedtime.  Dispense: 90 tablet; Refill: 1  7. Age-related osteoporosis without current pathological fracture  - alendronate (FOSAMAX) 70 MG tablet; Take 1 tablet (70 mg total) by mouth once a week. Reported on 02/13/2016  Dispense: 4 tablet; Refill: 12 - bone density    8. Weight loss  She states she is busy carrying from grandson, labs done in August were normal, explained she cannot lose any more weight. Daughter is here with her and will monitor her weight.

## 2017-04-01 ENCOUNTER — Emergency Department: Payer: Medicare Other

## 2017-04-01 ENCOUNTER — Encounter: Payer: Self-pay | Admitting: *Deleted

## 2017-04-01 DIAGNOSIS — Z5321 Procedure and treatment not carried out due to patient leaving prior to being seen by health care provider: Secondary | ICD-10-CM | POA: Insufficient documentation

## 2017-04-01 DIAGNOSIS — Y939 Activity, unspecified: Secondary | ICD-10-CM | POA: Insufficient documentation

## 2017-04-01 DIAGNOSIS — T189XXA Foreign body of alimentary tract, part unspecified, initial encounter: Secondary | ICD-10-CM | POA: Insufficient documentation

## 2017-04-01 DIAGNOSIS — Y999 Unspecified external cause status: Secondary | ICD-10-CM | POA: Diagnosis not present

## 2017-04-01 DIAGNOSIS — X58XXXA Exposure to other specified factors, initial encounter: Secondary | ICD-10-CM | POA: Diagnosis not present

## 2017-04-01 DIAGNOSIS — Y929 Unspecified place or not applicable: Secondary | ICD-10-CM | POA: Insufficient documentation

## 2017-04-01 DIAGNOSIS — R198 Other specified symptoms and signs involving the digestive system and abdomen: Secondary | ICD-10-CM | POA: Diagnosis not present

## 2017-04-01 NOTE — ED Triage Notes (Signed)
Pt states she swallowed a pill and she feels like it is caught in her throat since about 8pm. Pt has drank fluids since then without difficultly. Denies vomiting. Airway intact, lungs clear, no distress.

## 2017-04-02 ENCOUNTER — Emergency Department
Admission: EM | Admit: 2017-04-02 | Discharge: 2017-04-02 | Disposition: A | Discharge status: Home / Self Care | Payer: Medicare Other | Attending: Emergency Medicine | Admitting: Emergency Medicine

## 2017-04-02 NOTE — ED Notes (Signed)
Pt sitting in lobby with family member, no distress noted; pt updated on wait time and vs retaken; pt reports feels as if "pill has gone down"; denies any c/o pain or other c/o; st leaving now and will return for any new or worsening symptoms

## 2017-04-04 ENCOUNTER — Ambulatory Visit
Admission: RE | Admit: 2017-04-04 | Discharge: 2017-04-04 | Disposition: A | Discharge status: Home / Self Care | Payer: Medicare Other | Source: Ambulatory Visit | Attending: Family Medicine | Admitting: Family Medicine

## 2017-04-04 DIAGNOSIS — M533 Sacrococcygeal disorders, not elsewhere classified: Secondary | ICD-10-CM | POA: Diagnosis not present

## 2017-04-04 DIAGNOSIS — M8580 Other specified disorders of bone density and structure, unspecified site: Secondary | ICD-10-CM | POA: Insufficient documentation

## 2017-04-29 ENCOUNTER — Ambulatory Visit: Payer: Medicare Other | Admitting: Family Medicine

## 2017-06-19 ENCOUNTER — Encounter: Payer: Self-pay | Admitting: Family Medicine

## 2017-06-19 ENCOUNTER — Ambulatory Visit (INDEPENDENT_AMBULATORY_CARE_PROVIDER_SITE_OTHER): Payer: Medicare Other | Admitting: Family Medicine

## 2017-06-19 VITALS — BP 120/68 | HR 74 | Temp 97.9°F | Resp 16 | Ht 64.0 in | Wt 120.6 lb

## 2017-06-19 DIAGNOSIS — L910 Hypertrophic scar: Secondary | ICD-10-CM

## 2017-06-19 MED ORDER — HYDROCORTISONE 1 % EX CREA: 1.0000 "application " | TOPICAL_CREAM | Freq: Two times a day (BID) | CUTANEOUS | 0 refills | Status: DC

## 2017-06-19 NOTE — Progress Notes (Addendum)
Name: Lauren Eaton   MRN: [id number]    DOB: [birth year]   Date:06/19/2017       Progress Note  Subjective  Chief Complaint  Chief Complaint  Patient presents with  . Herpes Zoster    rash on abdomen and back    HPI  Pt presents with c/o rash x1 week to back. Mild itching last week that has now resolved, no pain but some soreness, no bleeding, drainage, no fatigue, NVD, no recent tick/insect bites.  Patient Active Problem List   Diagnosis Date Noted  . Behavioral change 08/09/2016  . Memory changes 08/09/2016  . 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 Dyslipidemia 07/20/2010  . OP (osteoporosis) 07/26/2009  . History of anemia 07/20/2009    Social History  Substance Use Topics  . Smoking status: Never Smoker  . Smokeless tobacco: Never Used     Comment: Used snuff, dips 3-4 dips/day, has used it 40-50 years. quit 10/2014.  Marland Kitchen Alcohol use No    Current Outpatient Prescriptions:  .  alendronate (FOSAMAX) 70 MG tablet, Take 1 tablet (70 mg total) by mouth once a week. Reported on 02/13/2016, Disp: 4 tablet, Rfl: 12 .  aspirin EC 81 MG tablet, Take 1 tablet (81 mg total) by mouth daily., Disp: 30 tablet, Rfl: 0 .  atorvastatin (LIPITOR) 40 MG tablet, Take 1 tablet (40 mg total) by mouth daily., Disp: 90 tablet, Rfl: 1 .  calcium-vitamin D (SM CALCIUM 500/VITAMIN D3) 500-400 MG-UNIT per tablet, Take 1 tablet by mouth daily., Disp: , Rfl:  .  donepezil (ARICEPT) 5 MG tablet, Take 1 tablet (5 mg total) by mouth at bedtime., Disp: 90 tablet, Rfl: 1 .  Multiple Vitamin (MULTI-VITAMINS) TABS, Take 1 tablet by mouth daily., Disp: , Rfl:  .  traZODone (DESYREL) 50 MG tablet, Take 0.5-1 tablets (25-50 mg total) by mouth at bedtime as needed for sleep., Disp: 90 tablet, Rfl: 1  No Known Allergies  ROS Constitutional: Negative for fever or weight change.  Respiratory: Negative for cough and shortness of breath.   Cardiovascular: Negative for chest pain or palpitations.  Gastrointestinal: Negative for  abdominal pain, no bowel changes.  Musculoskeletal: Negative for gait problem or joint swelling.  Skin: Positive for rash.  Neurological: Negative for dizziness or headache.  No other specific complaints in a complete review of systems (except as listed in HPI above).  Objective  Vitals:   06/19/17 0842  BP: 120/68  Pulse: 74  Resp: 16  Temp: 97.9 F (36.6 C)  TempSrc: Oral  SpO2: 98%  Weight: 120 lb 9.6 oz (54.7 kg)  Height: 5\' 4"  (1.626 m)   Body mass index is 20.7 kg/m.  Nursing Note and Vital Signs reviewed.  Physical Exam  Skin:      Constitutional: Patient appears well-developed and well-nourished.  No distress.  HEENT: head atraumatic, normocephalic Skin: Left Mid back has streak of raised, flesh colored scarring consistent with Keloid scarring that is non-erythematous, non-tender, no excoriation, no bleeding/exudate. Cardiovascular: Normal rate, regular rhythm, S1/S2 present.  No murmur or rub heard. No BLE edema. Pulmonary/Chest: Effort normal and breath sounds clear. No respiratory distress or retractions. Psychiatric: Patient has a normal mood and affect. behavior is normal. Judgment and thought content normal.  No results found for this or any previous visit (from the past 2160 hour(s)).   Assessment & Plan  1. Keloid scar of skin - hydrocortisone cream 1 %; Apply 1 application topically 2 (two) times daily.  Dispense: 30 g; Refill: 0 -  Use Maderma Scar care cream OTC. - Advised likely a scar that was irritated sometime last week. Because of its location, she may not have noticed the scar before this irritation. Recommend she monitor and use the medications as above.  -Red flags and when to present for emergency care or RTC including fever >101.53F, chest pain, shortness of breath, new/worsening/un-resolving symptoms, malaise/fatigue, pain/itching/redness reviewed with patient at time of visit. Follow up and care instructions discussed and provided in  AVS.  I have reviewed this encounter including the documentation in this note and/or discussed this patient with the Deboraha Sprangprovider,Samreen Seltzer, FNP, NP-C. I am certifying that I agree with the content of this note as supervising physician.  Alba CoryKrichna Sowles, MD [facility] 06/22/2017, 9:51 PM

## 2017-06-19 NOTE — Patient Instructions (Signed)
Purchase MADERMA Scar cream over the counter and apply per directions.

## 2017-07-03 ENCOUNTER — Other Ambulatory Visit: Payer: Self-pay | Admitting: Family Medicine

## 2017-07-03 DIAGNOSIS — Z1231 Encounter for screening mammogram for malignant neoplasm of breast: Secondary | ICD-10-CM

## 2017-07-21 ENCOUNTER — Ambulatory Visit
Admission: RE | Admit: 2017-07-21 | Discharge: 2017-07-21 | Disposition: A | Discharge status: Home / Self Care | Payer: Medicare Other | Source: Ambulatory Visit | Attending: Family Medicine | Admitting: Family Medicine

## 2017-07-21 DIAGNOSIS — Z1231 Encounter for screening mammogram for malignant neoplasm of breast: Secondary | ICD-10-CM

## 2017-08-01 ENCOUNTER — Ambulatory Visit: Payer: Medicare Other | Admitting: Family Medicine

## 2017-08-06 ENCOUNTER — Ambulatory Visit: Payer: Medicare Other | Admitting: Family Medicine

## 2017-08-11 ENCOUNTER — Encounter: Payer: Self-pay | Admitting: Family Medicine

## 2017-08-11 ENCOUNTER — Ambulatory Visit (INDEPENDENT_AMBULATORY_CARE_PROVIDER_SITE_OTHER): Payer: Medicare Other | Admitting: Family Medicine

## 2017-08-11 ENCOUNTER — Other Ambulatory Visit: Payer: Self-pay | Admitting: Family Medicine

## 2017-08-11 VITALS — BP 122/72 | HR 83 | Temp 97.9°F | Resp 16 | Ht 64.0 in | Wt 124.6 lb

## 2017-08-11 DIAGNOSIS — G4709 Other insomnia: Secondary | ICD-10-CM

## 2017-08-11 DIAGNOSIS — Z79899 Other long term (current) drug therapy: Secondary | ICD-10-CM

## 2017-08-11 DIAGNOSIS — M533 Sacrococcygeal disorders, not elsewhere classified: Secondary | ICD-10-CM

## 2017-08-11 DIAGNOSIS — G3184 Mild cognitive impairment, so stated: Secondary | ICD-10-CM | POA: Diagnosis not present

## 2017-08-11 DIAGNOSIS — R232 Flushing: Secondary | ICD-10-CM | POA: Diagnosis not present

## 2017-08-11 DIAGNOSIS — E785 Hyperlipidemia, unspecified: Secondary | ICD-10-CM | POA: Diagnosis not present

## 2017-08-11 DIAGNOSIS — Z23 Encounter for immunization: Secondary | ICD-10-CM

## 2017-08-11 DIAGNOSIS — R4689 Other symptoms and signs involving appearance and behavior: Secondary | ICD-10-CM | POA: Diagnosis not present

## 2017-08-11 DIAGNOSIS — M810 Age-related osteoporosis without current pathological fracture: Secondary | ICD-10-CM | POA: Diagnosis not present

## 2017-08-11 LAB — CBC WITH DIFFERENTIAL/PLATELET
Basophils Absolute: 0 cells/uL (ref 0–200)
Basophils Relative: 0 %
Eosinophils Absolute: 183 cells/uL (ref 15–500)
Eosinophils Relative: 3 %
HCT: 36.8 % (ref 35.0–45.0)
Hemoglobin: 11.9 g/dL (ref 11.7–15.5)
Lymphocytes Relative: 33 %
Lymphs Abs: 2013 cells/uL (ref 850–3900)
MCH: 30.1 pg (ref 27.0–33.0)
MCHC: 32.3 g/dL (ref 32.0–36.0)
MCV: 92.9 fL (ref 80.0–100.0)
MPV: 8.9 fL (ref 7.5–12.5)
Monocytes Absolute: 366 cells/uL (ref 200–950)
Monocytes Relative: 6 %
Neutro Abs: 3538 cells/uL (ref 1500–7800)
Neutrophils Relative %: 58 %
Platelets: 246 10*3/uL (ref 140–400)
RBC: 3.96 MIL/uL (ref 3.80–5.10)
RDW: 14.1 % (ref 11.0–15.0)
WBC: 6.1 10*3/uL (ref 3.8–10.8)

## 2017-08-11 MED ORDER — VENLAFAXINE HCL ER 37.5 MG PO CP24: 37.5000 mg | ORAL_CAPSULE | Freq: Every day | ORAL | 0 refills | Status: DC

## 2017-08-11 MED ORDER — TRAZODONE HCL 50 MG PO TABS: 25.0000 mg | ORAL_TABLET | Freq: Every evening | ORAL | 1 refills | Status: DC | PRN

## 2017-08-11 MED ORDER — ATORVASTATIN CALCIUM 40 MG PO TABS: 40.0000 mg | ORAL_TABLET | Freq: Every day | ORAL | 1 refills | Status: DC

## 2017-08-11 MED ORDER — DONEPEZIL HCL 5 MG PO TABS: 5.0000 mg | ORAL_TABLET | Freq: Every day | ORAL | 1 refills | Status: DC

## 2017-08-11 NOTE — Progress Notes (Signed)
Name: Lauren Eaton   MRN: [id number]    DOB: Mar 28, 1946   Date:08/11/2017       Progress Note  Subjective  Chief Complaint  Chief Complaint  Patient presents with  . Medication Refill    6 month F/U  . Coccyalgia    Still bothers her when sitting for a long periods of time  . Hyperlipidemia    Still exercising as she can  . Memory Loss    Stable    HPI  Osteoporosis: seen by Dr. Gavin Potters n August 2016, and was started on Fosamax, she had a gap, but has been taking it weekly since Feb 2017.She took almost two years of Forteo back in 2012 and 2013. Last bone density was 03/2015 and still showed osteoporosis. She is taking otc supplementations. We will scheduled bone density   Hyperlipidemia: on stating therapy, she avoids fried food, she still exercises and has been busier taking care of her 37 month old great-grandson, she is do for labs  Memory loss/change in behavior: daughter came in Summer 2017 with her today and states she has been forgetful ( asking same question) but not re-telling stories. She has never gotten lost. MMS was 29 at times of complaints. Positive family history of Alzheimer's - mother, also has a sister with symptoms but not formally diagnosed with dementia. She is taking  Aricept.  She has lost 3 more pounds since last visit, previously lost 10 lbs. Symptoms are stable, she needs assistance managing bills/money and going to appointments.   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  Insomnia: she states she sleeps well Trazodone, no side effects. She states she has a long history of hot flashes and still bothers her, wakes up at night and also has symptoms during the day, she would like to try medication for it.  Patient Active Problem List   Diagnosis Date Noted  . Behavioral change 08/09/2016  . Memory changes 08/09/2016  .  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 Dyslipidemia 07/20/2010  . OP (osteoporosis) 07/26/2009  . History of anemia 07/20/2009    Past Surgical History:  Procedure Laterality Date  . SURGERY OF LIP      Family History  Problem Relation Age of Onset  . Alzheimer's disease Mother   . Dementia Mother   . Early death Father        MVA  . CVA Sister 29       complications of  . Cancer Brother        stomach  . Cancer Brother        lung    Social History   Social History  . Marital status: Single    Spouse name: N/A  . Number of children: 1  . Years of education: N/A   Occupational History  . 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 Smoking status: Never Smoker  . Smokeless tobacco: Never Used     Comment: Used snuff, dips 3-4 dips/day, has used it 40-50 years. quit 10/2014.  Marland Kitchen Alcohol use No  . Drug use: No  . Sexual activity: Not Currently   Other Topics Concern  . Not on file   Social History Narrative   Lives alone in a senior citizen apartment, has friends and likes to take care of her plants and go  out with her friend/neighbors. Still able to drive - but only grocery stores and church. Currently watching her great-grandson - infant     Current Outpatient Prescriptions:  .  alendronate (FOSAMAX) 70 MG tablet, Take 1 tablet (70 mg total) by mouth once a week. Reported on 02/13/2016, Disp: 4 tablet, Rfl: 12 .  atorvastatin (LIPITOR) 40 MG tablet, Take 1 tablet (40 mg total) by mouth daily., Disp: 90 tablet, Rfl: 1 .  calcium-vitamin D (SM CALCIUM 500/VITAMIN D3) 500-400 MG-UNIT per tablet, Take 1 tablet by mouth daily., Disp: , Rfl:  .  donepezil (ARICEPT) 5 MG tablet, Take 1 tablet (5 mg total) by mouth at bedtime., Disp: 90 tablet, Rfl: 1 .  Multiple Vitamin (MULTI-VITAMINS) TABS, Take 1 tablet by mouth daily., Disp: , Rfl:  .  traZODone (DESYREL) 50 MG tablet, Take 0.5-1 tablets (25-50 mg total) by mouth at bedtime as needed for  sleep., Disp: 90 tablet, Rfl: 1 .  aspirin EC 81 MG tablet, Take 1 tablet (81 mg total) by mouth daily. (Patient not taking: Reported on 08/11/2017), Disp: 30 tablet, Rfl: 0 .  venlafaxine XR (EFFEXOR-XR) 37.5 MG 24 hr capsule, Take 1-2 capsules (37.5-75 mg total) by mouth daily with breakfast. Start with one and after first week take two, Disp: 60 capsule, Rfl: 0  No Known Allergies   ROS  Constitutional: Negative for fever or weight change.  Respiratory: Negative for cough and shortness of breath.   Cardiovascular: Negative for chest pain or palpitations.  Gastrointestinal: Negative for abdominal pain, no bowel changes.  Musculoskeletal: Negative for gait problem or joint swelling.  Skin: Negative for rash.  Neurological: Negative for dizziness or headache.  No other specific complaints in a complete review of systems (except as listed in HPI above).   Objective  Vitals:   08/11/17 1535  BP: 122/72  Pulse: 83  Resp: 16  Temp: 97.9 F (36.6 C)  TempSrc: Oral  SpO2: 98%  Weight: 124 lb 9.6 oz (56.5 kg)  Height: 5\' 4"  (1.626 m)    Body mass index is 21.39 kg/m.  Physical Exam  Constitutional: Patient appears well-developed and well-nourished. No distress.  HEENT: head atraumatic, normocephalic, pupils equal and reactive to light, neck supple, throat within normal limits Cardiovascular: Normal rate, regular rhythm and normal heart sounds.  No murmur heard. No BLE edema. Pulmonary/Chest: Effort normal and breath sounds normal. No respiratory distress. Abdominal: Soft.  There is no tenderness. Psychiatric: Patient has a normal mood and affect. behavior is normal. Judgment and thought content normal.  PHQ2/9: Depression screen [facility] 2/9 08/11/2017 01/27/2017 08/09/2016 02/13/2016 07/31/2015  Decreased Interest 0 0 0 0 0  Down, Depressed, Hopeless 0 0 0 0 0  PHQ - 2 Score 0 0 0 0 0     Fall Risk: Fall Risk  08/11/2017 01/27/2017 08/09/2016 02/13/2016 07/31/2015  Falls in the past  year? No No No No No     Functional Status Survey: Is the patient deaf or have difficulty hearing?: No Does the patient have difficulty seeing, even when wearing glasses/contacts?: No Does the patient have difficulty concentrating, remembering, or making decisions?: Yes Does the patient have difficulty walking or climbing stairs?: No Does the patient have difficulty dressing or bathing?: No Does the patient have difficulty doing errands alone such as visiting a doctor's office or shopping?: Yes   Assessment & Plan  1. Mild cognitive impairment  - donepezil (ARICEPT) 5 MG tablet; Take 1 tablet (5 mg total) by mouth at  bedtime.  Dispense: 90 tablet; Refill: 1  2. Other insomnia  - traZODone (DESYREL) 50 MG tablet; Take 0.5-1 tablets (25-50 mg total) by mouth at bedtime as needed for sleep.  Dispense: 90 tablet; Refill: 1  3. Dyslipidemia  - atorvastatin (LIPITOR) 40 MG tablet; Take 1 tablet (40 mg total) by mouth daily.  Dispense: 90 tablet; Refill: 1 -lipid panel   4. Age-related osteoporosis without current pathological fracture  Continue fosamax  5. Hot flashes  - venlafaxine XR (EFFEXOR-XR) 37.5 MG 24 hr capsule; Take 1-2 capsules (37.5-75 mg total) by mouth daily with breakfast. Start with one and after first week take two  Dispense: 60 capsule; Refill: 0 - TSH  6. Behavioral change  - donepezil (ARICEPT) 5 MG tablet; Take 1 tablet (5 mg total) by mouth at bedtime.  Dispense: 90 tablet; Refill: 1  7. Long-term use of high-risk medication  - COMPLETE METABOLIC PANEL WITH GFR - CBC with Differential/Platelet  8. Coccyalgia  She states she has been doing better  9. Needs flu shot  - Flu vaccine HIGH DOSE PF

## 2017-08-12 ENCOUNTER — Telehealth: Payer: Self-pay

## 2017-08-12 LAB — LIPID PANEL
Cholesterol: 140 mg/dL (ref <200)
HDL: 63 mg/dL (ref >50)
LDL Cholesterol: 63 mg/dL (ref <100)
Total CHOL/HDL Ratio: 2.2 Ratio (ref <5.0)
Triglycerides: 72 mg/dL (ref <150)
VLDL: 14 mg/dL (ref <30)

## 2017-08-12 LAB — COMPLETE METABOLIC PANEL WITH GFR
ALT: 15 U/L (ref 6–29)
AST: 23 U/L (ref 10–35)
Albumin: 4.2 g/dL (ref 3.6–5.1)
Alkaline Phosphatase: 56 U/L (ref 33–130)
BUN: 15 mg/dL (ref 7–25)
CO2: 27 mmol/L (ref 20–32)
Calcium: 9.3 mg/dL (ref 8.6–10.4)
Chloride: 106 mmol/L (ref 98–110)
Creat: 0.85 mg/dL (ref 0.50–0.99)
GFR, Est African American: 81 mL/min (ref >=60)
GFR, Est Non African American: 70 mL/min (ref >=60)
Glucose, Bld: 87 mg/dL (ref 65–99)
Potassium: 4.4 mmol/L (ref 3.5–5.3)
Sodium: 142 mmol/L (ref 135–146)
Total Bilirubin: 1 mg/dL (ref 0.2–1.2)
Total Protein: 7.1 g/dL (ref 6.1–8.1)

## 2017-08-12 LAB — TSH: TSH: 5.99 mIU/L — ABNORMAL HIGH

## 2017-08-12 NOTE — Telephone Encounter (Signed)
Left message for patient to call back regarding labs. 

## 2017-08-12 NOTE — Telephone Encounter (Signed)
-----   Message from [name], MD sent at 08/12/2017  7:53 AM EDT ----- Sugar, kidney and transaminases are within normal limits Elevated TSH, but not as high, we need thyroid panel added.  Normal CBC Normal lipid panel

## 2017-08-14 LAB — THYROID PROFILE - CHCC
Free Thyroxine Index: 2.2 (ref 1.4–3.8)
T3 Uptake: 26 % (ref 22–35)
T4, Total: 8.6 ug/dL (ref 5.1–11.9)

## 2017-11-14 ENCOUNTER — Other Ambulatory Visit: Payer: Self-pay | Admitting: Family Medicine

## 2017-11-14 DIAGNOSIS — E785 Hyperlipidemia, unspecified: Secondary | ICD-10-CM

## 2017-12-29 ENCOUNTER — Telehealth: Payer: Self-pay

## 2017-12-29 DIAGNOSIS — G4709 Other insomnia: Secondary | ICD-10-CM

## 2017-12-29 DIAGNOSIS — G3184 Mild cognitive impairment, so stated: Secondary | ICD-10-CM

## 2017-12-29 DIAGNOSIS — M810 Age-related osteoporosis without current pathological fracture: Secondary | ICD-10-CM

## 2017-12-29 DIAGNOSIS — R4689 Other symptoms and signs involving appearance and behavior: Secondary | ICD-10-CM

## 2017-12-29 NOTE — Telephone Encounter (Signed)
[facility] but states she does need any refills at this present time. She has enough until her appointment. Just a FYI. [facility] was requesting a refill on Alendronate, Trazodone and Donepezil. But patient does not need any at this time.

## 2018-02-06 ENCOUNTER — Telehealth: Payer: Self-pay

## 2018-02-06 NOTE — Telephone Encounter (Signed)
Called pt to sched AWV w/ [facility]. LVM requesting returned call.  

## 2018-02-07 ENCOUNTER — Other Ambulatory Visit: Payer: Self-pay | Admitting: Family Medicine

## 2018-02-07 DIAGNOSIS — E785 Hyperlipidemia, unspecified: Secondary | ICD-10-CM

## 2018-02-11 ENCOUNTER — Other Ambulatory Visit: Payer: Self-pay | Admitting: Family Medicine

## 2018-02-11 DIAGNOSIS — M810 Age-related osteoporosis without current pathological fracture: Secondary | ICD-10-CM

## 2018-02-11 NOTE — Telephone Encounter (Signed)
Refill request for general medication: Fosamax 70 mg  Last office visit: 08/01/2017  Last physical exam: 02/13/2016  Follow-up on file. 02/16/2018

## 2018-02-16 ENCOUNTER — Ambulatory Visit (INDEPENDENT_AMBULATORY_CARE_PROVIDER_SITE_OTHER): Payer: Medicare Other | Admitting: Family Medicine

## 2018-02-16 ENCOUNTER — Ambulatory Visit: Payer: Medicare Other

## 2018-02-16 ENCOUNTER — Encounter: Payer: Self-pay | Admitting: Family Medicine

## 2018-02-16 VITALS — BP 140/70 | HR 75 | Resp 14 | Ht 64.0 in | Wt 121.3 lb

## 2018-02-16 DIAGNOSIS — R7989 Other specified abnormal findings of blood chemistry: Secondary | ICD-10-CM

## 2018-02-16 DIAGNOSIS — R442 Other hallucinations: Secondary | ICD-10-CM

## 2018-02-16 DIAGNOSIS — H9313 Tinnitus, bilateral: Secondary | ICD-10-CM | POA: Diagnosis not present

## 2018-02-16 DIAGNOSIS — R946 Abnormal results of thyroid function studies: Secondary | ICD-10-CM | POA: Diagnosis not present

## 2018-02-16 DIAGNOSIS — G4709 Other insomnia: Secondary | ICD-10-CM | POA: Diagnosis not present

## 2018-02-16 DIAGNOSIS — R232 Flushing: Secondary | ICD-10-CM

## 2018-02-16 DIAGNOSIS — E785 Hyperlipidemia, unspecified: Secondary | ICD-10-CM | POA: Diagnosis not present

## 2018-02-16 DIAGNOSIS — G3184 Mild cognitive impairment, so stated: Secondary | ICD-10-CM

## 2018-02-16 MED ORDER — TRAZODONE HCL 50 MG PO TABS: 50.0000 mg | ORAL_TABLET | Freq: Every evening | ORAL | 1 refills | Status: DC | PRN

## 2018-02-16 MED ORDER — DONEPEZIL HCL 10 MG PO TABS: 10.0000 mg | ORAL_TABLET | Freq: Every day | ORAL | 0 refills | Status: DC

## 2018-02-16 NOTE — Addendum Note (Signed)
Addended by: Alba CorySOWLES, Makale Pindell F on: 02/16/2018 04:57 PM   Modules accepted: Orders

## 2018-02-16 NOTE — Progress Notes (Addendum)
Name: Lauren Eaton   MRN: [id number]    DOB: December 29, 1945   Date:02/16/2018       Progress Note  Subjective  Chief Complaint  Chief Complaint  Patient presents with  . Tinnitus    ringing in left ear and tapping in right ear x 47month denies pain.  . Memory Loss    follow up MME  . Hallucinations    HPI  Osteoporosis: seen by Dr. Gavin Potters n August 2016, and was started on Fosamax, she had a gap, but has been taking it weekly since Feb 2017.She took almost two years of Forteo back in 2012 and 2013. Last bone density was 03/2015 and still showed osteoporosis. She is taking otc supplementations. I sent order for bone density on her last visit, but it was not done  Hyperlipidemia: on stating therapy, she avoids fried food, she still exercises and has been busier taking care of her 2 year old great-grandson, last labs done 07/2017 reviewed and at goal.   Memory loss/change in behavior: diagnosed Summer 2017 because daughter noted  she had been forgetful ( asking same question) but not re-telling stories. She has never gotten lost and able to take care of great grandson. Lives alone. Marland Kitchen MMS initially was  28 at times of complaints, started on Aricept 5 mg and today it is 29. Marland Kitchen Positive family history of Alzheimer's - mother, also has a sister with symptoms but not formally diagnosed with dementia. She continues to lose weight, she states she does not eat not because she forgets, but because taking care of great-grandson. She is however getting paranoid. She thinks her neighbor is wicked, and is using laser beans to cause her face to tingle and burn and also has intermittent tinnitus on left ear and a cricket sound on right ear. She states the neighbors is doing that because she is jealous because she is bald and is trying to get the hair out of her head.   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 Pain is very seldom now, depending on the way she sits down.   Insomnia: she states she sleeps well Trazodone, no side effects. She also has hot flashes, we gave her Effexor, but she did not return for follow up, not sure if symptoms improved with medication   Patient Active Problem List   Diagnosis Date Noted  . Behavioral change 08/09/2016  . Memory changes 08/09/2016  . 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 Dyslipidemia 07/20/2010  . OP (osteoporosis) 07/26/2009  . History of anemia 07/20/2009    Past Surgical History:  Procedure Laterality Date  . SURGERY OF LIP      Family History  Problem Relation Age of Onset  . Alzheimer's disease Mother   . Dementia Mother   . Early death Father        MVA  . CVA Sister 61       complications of  . Cancer Brother        stomach  . Cancer Brother        lung    Social History   Socioeconomic History  . Marital status: Single    Spouse name: Not on file  . Number of children: 1  . Years of education: Not on file  . Highest education level: Not on file  Social Needs  . Financial resource strain: Not on  file  . Food insecurity - worry: Not on file  . Food insecurity - inability: Not on file  . Transportation needs - medical: Not on file  . Transportation needs - non-medical: Not on file  Occupational History  . 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 Smoking status: Never Smoker  . Smokeless tobacco: Never Used  . Tobacco comment: Used snuff, dips 3-4 dips/day, has used it 40-50 years. quit 10/2014.  Substance and Sexual Activity  . Alcohol use: No    Alcohol/week: 0.0 oz  . Drug use: No  . Sexual activity: Not Currently  Other Topics Concern  . Not on file  Social History Narrative   Lives alone in a senior citizen apartment, has friends and likes to take care of her plants and go out with her friend/neighbors. Still able to drive - but only  grocery stores and church. Currently watching her great-grandson - infant     Current Outpatient Medications:  .  alendronate (FOSAMAX) 70 MG tablet, take 1 tablet by mouth every week, Disp: 4 tablet, Rfl: 12 .  aspirin EC 81 MG tablet, Take 1 tablet (81 mg total) by mouth daily., Disp: 30 tablet, Rfl: 0 .  atorvastatin (LIPITOR) 40 MG tablet, take 1 tablet by mouth once daily, Disp: 90 tablet, Rfl: 1 .  calcium-vitamin D (SM CALCIUM 500/VITAMIN D3) 500-400 MG-UNIT per tablet, Take 1 tablet by mouth daily., Disp: , Rfl:  .  donepezil (ARICEPT) 10 MG tablet, Take 1 tablet (10 mg total) by mouth at bedtime., Disp: 30 tablet, Rfl: 0 .  Multiple Vitamin (MULTI-VITAMINS) TABS, Take 1 tablet by mouth daily., Disp: , Rfl:  .  traZODone (DESYREL) 50 MG tablet, Take 1 tablet (50 mg total) by mouth at bedtime as needed for sleep., Disp: 90 tablet, Rfl: 1  No Known Allergies   ROS  Constitutional: Negative for fever , positive for  weight change.  Respiratory: Negative for cough and shortness of breath.   Cardiovascular: Negative for chest pain or palpitations.  Gastrointestinal: Negative for abdominal pain, no bowel changes.  Musculoskeletal: Negative for gait problem or joint swelling.  Skin: Negative for rash.  Neurological: Negative for dizziness or headache.  No other specific complaints in a complete review of systems (except as listed in HPI above).  Objective  Vitals:   02/16/18 1612  BP: 140/70  Pulse: 75  Resp: 14  SpO2: 98%  Weight: 121 lb 4.8 oz (55 kg)  Height: 5\' 4"  (1.626 m)    Body mass index is 20.82 kg/m.  Physical Exam  Constitutional: Patient appears well-developed and well-nourished. No distress.  HEENT: head atraumatic, normocephalic, pupils equal and reactive to light, ears normal tM bilaterally,  neck supple, throat within normal limits Cardiovascular: Normal rate, regular rhythm and normal heart sounds.  No murmur heard. No BLE edema. Pulmonary/Chest:  Effort normal and breath sounds normal. No respiratory distress. Abdominal: Soft.  There is no tenderness. Psychiatric: Patient has a normal mood and affect. behavior is normal.   PHQ2/9: Depression screen [facility] 2/9 08/11/2017 01/27/2017 08/09/2016 02/13/2016 07/31/2015  Decreased Interest 0 0 0 0 0  Down, Depressed, Hopeless 0 0 0 0 0  PHQ - 2 Score 0 0 0 0 0     Fall Risk: Fall Risk  08/11/2017 01/27/2017 08/09/2016 02/13/2016 07/31/2015  Falls in the past year? No No No No No     Assessment & Plan  1. Mild cognitive impairment  - donepezil (ARICEPT) 10 MG tablet; Take 1 tablet (10 mg total) by mouth at bedtime.  Dispense: 30 tablet; Refill: 0  2. Other hallucinations  Refer neurologist, it may be secondary to dementia, initial labs negative RPR, b12, CBC comp panel, may need MRI since now also has tinnitus.   3. Other insomnia  - traZODone (DESYREL) 50 MG tablet; Take 1 tablet (50 mg total) by mouth at bedtime as needed for sleep.  Dispense: 90 tablet; Refill: 1  4. Dyslipidemia  Continue statin therapy  5. Hot flashes  Hold off on medications for now  6. Tinnitus aurium, bilateral  - Ambulatory referral to Neurology   7. Abnormal thyroid stimulating hormone (TSH) level  - Thyroid Panel With TSH

## 2018-02-17 LAB — THYROID PANEL WITH TSH
Free Thyroxine Index: 2.3 (ref 1.4–3.8)
T3 Uptake: 28 % (ref 22–35)
T4, Total: 8.3 ug/dL (ref 5.1–11.9)
TSH: 3.49 mIU/L (ref 0.40–4.50)

## 2018-03-03 ENCOUNTER — Encounter: Payer: Self-pay | Admitting: Family Medicine

## 2018-04-27 ENCOUNTER — Encounter: Payer: Self-pay | Admitting: Neurology

## 2018-04-27 ENCOUNTER — Telehealth: Payer: Self-pay | Admitting: Neurology

## 2018-04-27 ENCOUNTER — Ambulatory Visit: Payer: Medicare Other | Admitting: Neurology

## 2018-04-27 ENCOUNTER — Other Ambulatory Visit: Payer: Self-pay

## 2018-04-27 VITALS — BP 144/74 | HR 66 | Ht 64.0 in | Wt 117.5 lb

## 2018-04-27 DIAGNOSIS — R413 Other amnesia: Secondary | ICD-10-CM | POA: Diagnosis not present

## 2018-04-27 NOTE — Telephone Encounter (Signed)
Medicaid order sent to [facility]. They obtain the auth and will reach out to the pt to schedule.  °

## 2018-04-27 NOTE — Patient Instructions (Signed)
    We will check MRI of the brain. 

## 2018-04-27 NOTE — Progress Notes (Signed)
Reason for visit: Memory disorder  Referring physician: Dr. Steffanie Rainwater [name]  History of present illness:  Lauren Eaton is a 72 year old black female with a history of a progressive memory disorder for at least 2 years or more.  The patient is on Aricept taking 10 mg at night.  Over the last year and a half, the patient has developed delusional thinking that involves the concept that her neighbor in her apartment building has a laser beam that is projected into her apartment and it is resulting in tinnitus, hair loss, itching sensations throughout, and burning in the head.  The patient apparently does operate a motor vehicle, she is able to drive short distances in town.  She manages her own finances, she takes her own medications, she has required some assistance with keeping up with appointments over the last 5 years.  Her daughter does this.  The patient lives alone in her apartment.  The patient reports no focal numbness or weakness of the face, arms, or legs.  She denies any headaches or dizziness.  She denies issues controlling the bowels or the bladder.  She has not had any balance problems.  She is sleeping fairly well at night currently.  She is sent to this office for an evaluation.  Blood work has been done to include a vitamin B12 level and an RPR that were unremarkable.  Past Medical History:  Diagnosis Date  . Abnormal finding on thyroid function test    recheck labs   . Abnormal glucose   . Chronic insomnia   . Dyslipidemia    onset 07/20/10  . History of anemia   . Osteoporosis    she took forteo in 2012, but has been off medication, bone density is still showing osteoporosis, refer to rheumatologist  . Vitamin D deficiency     Past Surgical History:  Procedure Laterality Date  . SURGERY OF LIP      Family History  Problem Relation Age of Onset  . Alzheimer's disease Mother   . Dementia Mother   . Early death Father        MVA  . CVA  Sister 63       complications of  . Cancer Brother        stomach  . Cancer Brother        lung    Social history:  reports that she has never smoked. She has quit using smokeless tobacco. Her smokeless tobacco use included snuff. She reports that she does not drink alcohol or use drugs.  Medications:  Prior to Admission medications   Medication Sig Start Date End Date Taking? Authorizing Provider  alendronate (FOSAMAX) 70 MG tablet take 1 tablet by mouth every week 02/13/18  Yes Lauren Eaton  aspirin EC 81 MG tablet Take 1 tablet (81 mg total) by mouth daily. 02/13/16  Yes Lauren Eaton  atorvastatin (LIPITOR) 40 MG tablet take 1 tablet by mouth once daily 02/08/18  Yes Lauren Eaton  calcium-vitamin D (SM CALCIUM 500/VITAMIN D3) 500-400 MG-UNIT per tablet Take 1 tablet by mouth daily.   Yes Provider, Historical, Eaton  donepezil (ARICEPT) 10 MG tablet Take 1 tablet (10 mg total) by mouth at bedtime. 02/16/18  Yes Lauren Eaton  Multiple Vitamin (MULTI-VITAMINS) TABS Take 1 tablet by mouth daily.   Yes Provider, Historical, Eaton  traZODone (DESYREL) 50 MG tablet Take 1 tablet (50 mg total) by mouth at bedtime  as needed for sleep. 02/16/18  Yes Lauren Eaton     No Known Allergies  ROS:  Out of a complete 14 system review of symptoms, the patient complains only of the following symptoms, and all other reviewed systems are negative.  Ringing in the ears Itching Confusion  Blood pressure (!) 144/74, pulse 66, height  (1.626 m), weight 117 lb 8 oz (53.3 kg).  Physical Exam  General: The patient is alert and cooperative at the time of the examination.  Eyes: Pupils are equal, round, and reactive to light. Discs are flat bilaterally.  Neck: The neck is supple, no carotid bruits are noted.  Respiratory: The respiratory examination is clear.  Cardiovascular: The cardiovascular examination reveals a regular rate and rhythm, no obvious murmurs or rubs are  noted.  Skin: Extremities are without significant edema.  Neurologic Exam  Mental status: The patient is alert and oriented x 3 at the time of the examination. The Mini-Mental status examination done today shows a total score 22/30.  Cranial nerves: Facial symmetry is present. There is good sensation of the face to pinprick and soft touch bilaterally. The strength of the facial muscles and the muscles to head turning and shoulder shrug are normal bilaterally. Speech is well enunciated, no aphasia or dysarthria is noted. Extraocular movements are full. Visual fields are full. The tongue is midline, and the patient has symmetric elevation of the soft palate. No obvious hearing deficits are noted.  Motor: The motor testing reveals 5 over 5 strength of all 4 extremities. Good symmetric motor tone is noted throughout.  Sensory: Sensory testing is intact to pinprick, soft touch, vibration sensation, and position sense on all 4 extremities. No evidence of extinction is noted.  Coordination: Cerebellar testing reveals good finger-nose-finger and heel-to-shin bilaterally.  Gait and station: Gait is normal. Tandem gait is normal. Romberg is negative. No drift is seen.  Reflexes: Deep tendon reflexes are symmetric and normal bilaterally. Toes are downgoing bilaterally.   Assessment/Plan:  1.  Progressive memory disturbance  2.  Delusional thinking  The delusional thinking is a manifestation of her underlying progressive memory disturbance.  Unfortunately, medications to not usually help with this issue.  The patient is not becoming extremely agitated, she is not calling the police or calling her family in the middle of the night.  The patient will remain on Aricept, MRI of the brain will be done.  The patient will follow-up in 6 months.  Lauren Palau Eaton 04/27/2018 3:36 PM  [facility] [address] [place], [place] [address]  Phone [phone] Fax  [phone]

## 2018-05-22 ENCOUNTER — Other Ambulatory Visit: Payer: Self-pay | Admitting: Neurology

## 2018-05-22 DIAGNOSIS — R413 Other amnesia: Secondary | ICD-10-CM

## 2018-05-24 ENCOUNTER — Inpatient Hospital Stay: Admission: RE | Admit: 2018-05-24 | Payer: Medicare Other | Source: Ambulatory Visit

## 2018-05-24 ENCOUNTER — Other Ambulatory Visit: Payer: Medicare Other

## 2018-05-24 ENCOUNTER — Ambulatory Visit
Admission: RE | Admit: 2018-05-24 | Discharge: 2018-05-24 | Disposition: A | Discharge status: Home / Self Care | Payer: Medicare Other | Source: Ambulatory Visit | Attending: Neurology | Admitting: Neurology

## 2018-05-24 DIAGNOSIS — R413 Other amnesia: Secondary | ICD-10-CM

## 2018-05-25 ENCOUNTER — Telehealth: Payer: Self-pay | Admitting: Neurology

## 2018-05-25 NOTE — Telephone Encounter (Signed)
  I called the patient, talk with the daughter.  MRI of the brain shows very minimal white matter changes, some atrophy, no specific changes that would cause difficulty with memory.  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  The agitation is a significant problem, adding Lexapro or Celexa may be reasonable.  MRI brain 05/24/18:  IMPRESSION: Unremarkable MRI scan of the brain without contrast showing only age-appropriate changes of mild chronic microvascular ischemia and supratentorial cortical atrophy.  No acute abnormalities are noted.

## 2018-05-27 DIAGNOSIS — L219 Seborrheic dermatitis, unspecified: Secondary | ICD-10-CM | POA: Diagnosis not present

## 2018-07-02 ENCOUNTER — Other Ambulatory Visit: Payer: Self-pay | Admitting: Family Medicine

## 2018-07-02 DIAGNOSIS — Z1231 Encounter for screening mammogram for malignant neoplasm of breast: Secondary | ICD-10-CM

## 2018-07-14 DIAGNOSIS — L03114 Cellulitis of left upper limb: Secondary | ICD-10-CM | POA: Diagnosis not present

## 2018-08-07 ENCOUNTER — Other Ambulatory Visit: Payer: Self-pay | Admitting: Family Medicine

## 2018-08-07 DIAGNOSIS — E785 Hyperlipidemia, unspecified: Secondary | ICD-10-CM

## 2018-08-07 DIAGNOSIS — G4709 Other insomnia: Secondary | ICD-10-CM

## 2018-08-09 NOTE — Telephone Encounter (Signed)
She needs follow up. I am sending one refill. Please schedule follow up

## 2018-08-10 NOTE — Telephone Encounter (Signed)
Called [phone] left voice message indication that two prescriptions has been sent to pharmacy but asked her to return call to schedule appt

## 2018-08-10 NOTE — Telephone Encounter (Signed)
[phone]  Called [phone] and left voice message indicating that two prescriptions has been sent to pharmacy but asked pt to return call to schedule appt for further refills

## 2018-09-01 ENCOUNTER — Telehealth: Payer: Self-pay | Admitting: Family Medicine

## 2018-09-01 NOTE — Telephone Encounter (Signed)
Pt daughter says that all refills from now on [facility] to [facility] [address].

## 2018-09-01 NOTE — Telephone Encounter (Signed)
Called daughter and the correct pharmacy is [facility] on [address] , fixed in chart and they asked me to [facility].

## 2018-10-14 DIAGNOSIS — H2513 Age-related nuclear cataract, bilateral: Secondary | ICD-10-CM | POA: Diagnosis not present

## 2018-10-14 DIAGNOSIS — H5213 Myopia, bilateral: Secondary | ICD-10-CM | POA: Diagnosis not present

## 2018-10-21 ENCOUNTER — Ambulatory Visit
Admission: RE | Admit: 2018-10-21 | Discharge: 2018-10-21 | Disposition: A | Discharge status: Home / Self Care | Payer: Medicare Other | Source: Ambulatory Visit | Attending: Family Medicine | Admitting: Family Medicine

## 2018-10-21 DIAGNOSIS — Z1231 Encounter for screening mammogram for malignant neoplasm of breast: Secondary | ICD-10-CM

## 2018-11-18 ENCOUNTER — Ambulatory Visit: Payer: Medicaid Other | Admitting: Adult Health

## 2018-12-17 DIAGNOSIS — H524 Presbyopia: Secondary | ICD-10-CM | POA: Diagnosis not present

## 2019-01-14 ENCOUNTER — Other Ambulatory Visit: Payer: Self-pay | Admitting: Family Medicine

## 2019-01-14 DIAGNOSIS — G4709 Other insomnia: Secondary | ICD-10-CM

## 2019-01-14 NOTE — Telephone Encounter (Signed)
Refill request for general medication. Trazodone to [facility].   Last office visit 02/16/2018

## 2019-02-14 ENCOUNTER — Telehealth: Payer: Self-pay | Admitting: Family Medicine

## 2019-02-14 DIAGNOSIS — E785 Hyperlipidemia, unspecified: Secondary | ICD-10-CM

## 2019-02-15 NOTE — Telephone Encounter (Signed)
Tried 3 times with the number on file and all 3 times said that the number is not in service. Could not reach

## 2019-02-15 NOTE — Telephone Encounter (Signed)
She needs follow up

## 2019-05-09 ENCOUNTER — Other Ambulatory Visit: Payer: Self-pay | Admitting: Family Medicine

## 2019-05-09 DIAGNOSIS — M810 Age-related osteoporosis without current pathological fracture: Secondary | ICD-10-CM

## 2019-05-11 NOTE — Telephone Encounter (Signed)
The number on file is not a working number

## 2019-08-11 ENCOUNTER — Ambulatory Visit (INDEPENDENT_AMBULATORY_CARE_PROVIDER_SITE_OTHER): Payer: Medicare Other | Admitting: Family Medicine

## 2019-08-11 ENCOUNTER — Encounter: Payer: Self-pay | Admitting: Family Medicine

## 2019-08-11 ENCOUNTER — Other Ambulatory Visit: Payer: Self-pay

## 2019-08-11 VITALS — BP 150/98 | HR 70 | Temp 96.9°F | Resp 16 | Ht 64.0 in | Wt 123.4 lb

## 2019-08-11 DIAGNOSIS — Z23 Encounter for immunization: Secondary | ICD-10-CM | POA: Diagnosis not present

## 2019-08-11 DIAGNOSIS — Z1159 Encounter for screening for other viral diseases: Secondary | ICD-10-CM | POA: Diagnosis not present

## 2019-08-11 DIAGNOSIS — R7989 Other specified abnormal findings of blood chemistry: Secondary | ICD-10-CM

## 2019-08-11 DIAGNOSIS — R442 Other hallucinations: Secondary | ICD-10-CM

## 2019-08-11 DIAGNOSIS — G3184 Mild cognitive impairment, so stated: Secondary | ICD-10-CM | POA: Diagnosis not present

## 2019-08-11 DIAGNOSIS — M810 Age-related osteoporosis without current pathological fracture: Secondary | ICD-10-CM | POA: Diagnosis not present

## 2019-08-11 DIAGNOSIS — R030 Elevated blood-pressure reading, without diagnosis of hypertension: Secondary | ICD-10-CM | POA: Diagnosis not present

## 2019-08-11 DIAGNOSIS — E785 Hyperlipidemia, unspecified: Secondary | ICD-10-CM | POA: Diagnosis not present

## 2019-08-11 DIAGNOSIS — Z1231 Encounter for screening mammogram for malignant neoplasm of breast: Secondary | ICD-10-CM

## 2019-08-11 DIAGNOSIS — G4709 Other insomnia: Secondary | ICD-10-CM

## 2019-08-11 MED ORDER — TRAZODONE HCL 50 MG PO TABS: 50.0000 mg | ORAL_TABLET | Freq: Every day | ORAL | 0 refills | Status: DC

## 2019-08-11 MED ORDER — ALENDRONATE SODIUM 70 MG PO TABS: 70.0000 mg | ORAL_TABLET | ORAL | 5 refills | Status: DC

## 2019-08-11 MED ORDER — DONEPEZIL HCL 10 MG PO TABS: 10.0000 mg | ORAL_TABLET | Freq: Every day | ORAL | 1 refills | Status: DC

## 2019-08-11 NOTE — Progress Notes (Signed)
Name: Lauren Eaton   MRN: [id number]    DOB: December 28, 1945   Date:08/11/2019       Progress Note  Subjective  Chief Complaint  Chief Complaint  Patient presents with  . Medication Refill  . Mild Cognitive impairment  . Osteoporosis  . Hyperlipidemia  . Insomnia  . Coccyalgia    HPI  Osteoporosis: seen by Dr. Jefm Bryant n August 2016, and was started on Fosamax, she had a gap, but has been taking it weekly since Feb 2017.She took almost two years of Forteo back in 2012 and 2013. Last bone density was 03/2015 and still showed osteoporosis. She is taking otc supplementations.Discussed putting her back on Alendronate and check bone density   Elevated bp: first time her bp has been high, daughter states not very active and has been eating out ( fast food) and or TV dinners, daughter will monitor her bp at home over the next week and call us back with bp values   Hyperlipidemia: on statin therapy, she has been eating out a lot lately - usually fast food, she has not been active, she was taking care of great grandson and he is now at daycare so her daughter is calling her "lazy"   Memory loss/change in behavior: diagnosed Summer 2017 because daughter noted  she had been forgetful ( asking same question) but not re-telling stories. She has never gotten lost and able to take care of great grandson. Lives alone. Marland Kitchen MMS initially was  28 at times of complaints, started on Aricept 5 and level was stable at 29, we did not have time to recheck MMS today . Positive family history of Alzheimer's - mother, also has a sister with symptoms but not formally diagnosed with dementia. She was getting very frustrated with previous neighbor and was calling wicked, she moved out of the apartment complex, she still states that previous neighbors still bother her " they have devices and area sill inside of her house" . 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   Insomnia: she states she sleeps well Trazodone, no side effects. She also has hot flashes but states doing better and is not taking Effexor    Patient Active Problem List   Diagnosis Date Noted  . Behavioral change 08/09/2016  . Memory changes 08/09/2016  . 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 Abnormal thyroid function test 07/20/2010  . Dyslipidemia 07/20/2010  . OP (osteoporosis) 07/26/2009  . History of anemia 07/20/2009    Past Surgical History:  Procedure Laterality Date  . SURGERY OF LIP      Family History  Problem Relation Age of Onset  . Alzheimer's disease Mother   . Dementia Mother   . Early death Father        MVA  . CVA Sister 22       complications of  . Cancer Brother        stomach  . Cancer Brother        lung  . Breast cancer Neg Hx     Social History   Socioeconomic History  . Marital status: Single    Spouse name: Not on file  . Number of children: 1  . Years of education: 4  . Highest education level: Not on  file  Occupational History  . 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 Financial resource strain: Not hard at all  . Food insecurity    Worry: Never true    Inability: Never true  . Transportation needs    Medical: No    Non-medical: No  Tobacco Use  . Smoking status: Never Smoker  . Smokeless tobacco: Former NeurosurgeonUser    Types: Snuff  . Tobacco comment: Used snuff, dips 3-4 dips/day, has used it 40-50 years. quit 10/2014.  Substance and Sexual Activity  . Alcohol use: No    Alcohol/week: 0.0 standard drinks  . Drug use: No  . Sexual activity: Not Currently  Lifestyle  . Physical activity    Days per week: 7 days    Minutes per session: 10 min  . Stress: Not at all  Relationships  . Social connections    Talks on  phone: More than three times a week    Gets together: More than three times a week    Attends religious service: More than 4 times per year    Active member of club or organization: No    Attends meetings of clubs or organizations: Never    Relationship status: Widowed  . Intimate partner violence    Fear of current or ex partner: No    Emotionally abused: No    Physically abused: No    Forced sexual activity: No  Other Topics Concern  . Not on file  Social History Narrative   Lives alone in a senior citizen apartment, has friends and likes to take care of her plants and go out with her friend/neighbors.    Still able to drive - but only grocery stores and church.    Currently watching her great-grandson - infant   Caffeine use: Soda daily   Right handed     Current Outpatient Medications:  .  alendronate (FOSAMAX) 70 MG tablet, take 1 tablet by mouth every week, Disp: 4 tablet, Rfl: 12 .  aspirin EC 81 MG tablet, Take 1 tablet (81 mg total) by mouth daily., Disp: 30 tablet, Rfl: 0 .  atorvastatin (LIPITOR) 40 MG tablet, TAKE 1 TABLET BY MOUTH ONCE DAILY, Disp: 90 tablet, Rfl: 0 .  calcium-vitamin D (SM CALCIUM 500/VITAMIN D3) 500-400 MG-UNIT per tablet, Take 1 tablet by mouth daily., Disp: , Rfl:  .  donepezil (ARICEPT) 10 MG tablet, Take 1 tablet (10 mg total) by mouth at bedtime., Disp: 30 tablet, Rfl: 0 .  Multiple Vitamin (MULTI-VITAMINS) TABS, Take 1 tablet by mouth daily., Disp: , Rfl:  .  traZODone (DESYREL) 50 MG tablet, TAKE 1 TABLET BY MOUTH AT BEDTIME IF NEEDED FOR SLEEP, Disp: 90 tablet, Rfl: 0  No Known Allergies  I personally reviewed active problem list, medication list, allergies, family history, social history, health maintenance with the patient/caregiver today.   ROS  Constitutional: Negative for fever or weight change.  Respiratory: Negative for cough and shortness of breath.   Cardiovascular: Negative for chest pain or palpitations.  Gastrointestinal:  Negative for abdominal pain, no bowel changes.  Musculoskeletal: Negative for gait problem or joint swelling.  Skin: Negative for rash.  Neurological: Negative for dizziness or headache.  No other specific complaints in a complete review of systems (except as listed in HPI above).  Objective  Vitals:   08/11/19 1542 08/11/19 1555 08/11/19 1622  BP: (!) 172/92 (!) 152/70 (!) 150/98  Pulse: 70    Resp: 16    Temp: (!) 96.9 F (36.1 C)    TempSrc: Temporal    SpO2: 98%    Weight: 123 lb 6.4 oz (56 kg)    Height: 5\' 4"  (1.626 m)      Body mass index is 21.18 kg/m.  Physical Exam  Constitutional: Patient appears well-developed and well-nourished.  No distress.  HEENT: head atraumatic, normocephalic, pupils equal and reactive to light,  neck supple Cardiovascular: Normal rate, regular rhythm and normal heart sounds.  No murmur heard. No BLE edema. Pulmonary/Chest: Effort normal and breath sounds normal. No respiratory distress. Abdominal: Soft.  There is no tenderness. Psychiatric: Patient has a normal mood and affect. behavior is normal. Judgment and thought content normal.  PHQ2/9: Depression screen [facility] 2/9 08/11/2019 08/11/2017 01/27/2017 08/09/2016 02/13/2016  Decreased Interest 0 0 0 0 0  Down, Depressed, Hopeless 0 0 0 0 0  PHQ - 2 Score 0 0 0 0 0  Altered sleeping 0 - - - -  Tired, decreased energy 0 - - - -  Change in appetite 0 - - - -  Feeling bad or failure about yourself  0 - - - -  Trouble concentrating 0 - - - -  Moving slowly or fidgety/restless 0 - - - -  Suicidal thoughts 0 - - - -  PHQ-9 Score 0 - - - -  Difficult doing work/chores Not difficult at all - - - -    phq 9 is negative   Fall Risk: Fall Risk  08/11/2019 08/11/2017 01/27/2017 08/09/2016 02/13/2016  Falls in the past year? 0 No No No No  Number falls in past yr: 0 - - - -  Injury with Fall? 0 - - - -     Functional Status Survey: Is the patient deaf or have difficulty hearing?: No Does the  patient have difficulty seeing, even when wearing glasses/contacts?: Yes Does the patient have difficulty concentrating, remembering, or making decisions?: Yes Does the patient have difficulty walking or climbing stairs?: No Does the patient have difficulty dressing or bathing?: No Does the patient have difficulty doing errands alone such as visiting a doctor's office or shopping?: Yes    Assessment & Plan  1. Dyslipidemia  - Lipid panel  2. Mild cognitive impairment  She needs to return for MMS   3. Other hallucinations  stable  4. Other insomnia  On Trazodone   5. Age-related osteoporosis without current pathological fracture  - VITAMIN D 25 Hydroxy (Vit-D Deficiency, Fractures) - DG Bone Density; Future  6. Abnormal thyroid stimulating hormone (TSH) level  - TSH  7. Elevated blood pressure reading  - Microalbumin / creatinine urine ratio - COMPLETE METABOLIC PANEL WITH GFR - CBC with Differential/Platelet  8. Need for hepatitis C screening test  - Hepatitis C antibody  9. Encounter for screening mammogram for breast cancer  - MM 3D SCREEN BREAST BILATERAL; Future  10. Needs flu shot  - Flu Vaccine QUAD High Dose(Fluad)

## 2019-08-12 LAB — CBC WITH DIFFERENTIAL/PLATELET
Absolute Monocytes: 431 cells/uL (ref 200–950)
Basophils Absolute: 41 cells/uL (ref 0–200)
Basophils Relative: 0.7 %
Eosinophils Absolute: 100 cells/uL (ref 15–500)
Eosinophils Relative: 1.7 %
HCT: 37.8 % (ref 35.0–45.0)
Hemoglobin: 12.4 g/dL (ref 11.7–15.5)
Lymphs Abs: 2319 cells/uL (ref 850–3900)
MCH: 30.2 pg (ref 27.0–33.0)
MCHC: 32.8 g/dL (ref 32.0–36.0)
MCV: 92.2 fL (ref 80.0–100.0)
MPV: 9.3 fL (ref 7.5–12.5)
Monocytes Relative: 7.3 %
Neutro Abs: 3009 cells/uL (ref 1500–7800)
Neutrophils Relative %: 51 %
Platelets: 259 10*3/uL (ref 140–400)
RBC: 4.1 10*6/uL (ref 3.80–5.10)
RDW: 13.1 % (ref 11.0–15.0)
Total Lymphocyte: 39.3 %
WBC: 5.9 10*3/uL (ref 3.8–10.8)

## 2019-08-12 LAB — COMPLETE METABOLIC PANEL WITH GFR
AG Ratio: 1.3 (calc) (ref 1.0–2.5)
ALT: 13 U/L (ref 6–29)
AST: 25 U/L (ref 10–35)
Albumin: 4.4 g/dL (ref 3.6–5.1)
Alkaline phosphatase (APISO): 55 U/L (ref 37–153)
BUN: 12 mg/dL (ref 7–25)
CO2: 28 mmol/L (ref 20–32)
Calcium: 9.8 mg/dL (ref 8.6–10.4)
Chloride: 105 mmol/L (ref 98–110)
Creat: 0.78 mg/dL (ref 0.60–0.93)
GFR, Est African American: 89 mL/min/{1.73_m2} (ref >=60)
GFR, Est Non African American: 76 mL/min/{1.73_m2} (ref >=60)
Globulin: 3.4 g/dL (calc) (ref 1.9–3.7)
Glucose, Bld: 82 mg/dL (ref 65–99)
Potassium: 4.4 mmol/L (ref 3.5–5.3)
Sodium: 141 mmol/L (ref 135–146)
Total Bilirubin: 0.5 mg/dL (ref 0.2–1.2)
Total Protein: 7.8 g/dL (ref 6.1–8.1)

## 2019-08-12 LAB — TSH: TSH: 5.17 mIU/L — ABNORMAL HIGH (ref 0.40–4.50)

## 2019-08-12 LAB — LIPID PANEL
Cholesterol: 247 mg/dL — ABNORMAL HIGH (ref <200)
HDL: 67 mg/dL (ref >=50)
LDL Cholesterol (Calc): 140 mg/dL (calc) — ABNORMAL HIGH
Non-HDL Cholesterol (Calc): 180 mg/dL (calc) — ABNORMAL HIGH (ref <130)
Total CHOL/HDL Ratio: 3.7 (calc) (ref <5.0)
Triglycerides: 263 mg/dL — ABNORMAL HIGH (ref <150)

## 2019-08-12 LAB — VITAMIN D 25 HYDROXY (VIT D DEFICIENCY, FRACTURES): Vit D, 25-Hydroxy: 51 ng/mL (ref 30–100)

## 2019-08-12 LAB — MICROALBUMIN / CREATININE URINE RATIO
Creatinine, Urine: 66 mg/dL (ref 20–275)
Microalb Creat Ratio: 11 mcg/mg creat (ref <30)
Microalb, Ur: 0.7 mg/dL

## 2019-08-12 LAB — HEPATITIS C ANTIBODY
Hepatitis C Ab: NONREACTIVE
SIGNAL TO CUT-OFF: 0.02 (ref <1.00)

## 2019-08-13 ENCOUNTER — Other Ambulatory Visit: Payer: Self-pay

## 2019-08-13 ENCOUNTER — Telehealth: Payer: Self-pay

## 2019-08-13 ENCOUNTER — Other Ambulatory Visit: Payer: Self-pay | Admitting: Family Medicine

## 2019-08-13 DIAGNOSIS — E785 Hyperlipidemia, unspecified: Secondary | ICD-10-CM

## 2019-08-13 MED ORDER — ATORVASTATIN CALCIUM 40 MG PO TABS: 40.0000 mg | ORAL_TABLET | Freq: Every day | ORAL | 1 refills | Status: DC

## 2019-08-13 NOTE — Progress Notes (Signed)
Patient called.  Patient aware. Once her Atorvastatin ran out. She stopped taking it. She has not had any prescribed since last year. Please send refill.

## 2019-08-24 ENCOUNTER — Telehealth: Payer: Self-pay

## 2019-08-24 NOTE — Telephone Encounter (Signed)
Copied from CRM #[id number]. Topic: General - Other >> Aug 24, 2019  2:33 PM Davis, Karen A wrote: Reason for CRM:Patient daughter called in with BP readings last week for Dr Sowles8/31/2020 141/66 / 08/17/2019 117/60 / 08/18/2019  122/64  / 08/19/2019 122/63 / 08/20/2019 114/65 

## 2019-08-24 NOTE — Telephone Encounter (Signed)
Copied from [place] [id number]. Topic: General - Other >> Aug 24, 2019  2:33 PM Leward Quan A wrote: Reason for TZG:YFVCBSW daughter called in with BP readings last week for Dr Sowles8/31/2020 141/66 / 08/17/2019 117/60 / 08/18/2019  122/64  / 08/19/2019 122/63 / 08/20/2019 114/65

## 2019-10-21 ENCOUNTER — Ambulatory Visit (INDEPENDENT_AMBULATORY_CARE_PROVIDER_SITE_OTHER): Payer: Medicare Other

## 2019-10-21 VITALS — BP 153/79 | Ht 64.0 in | Wt 123.0 lb

## 2019-10-21 DIAGNOSIS — Z0000 Encounter for general adult medical examination without abnormal findings: Secondary | ICD-10-CM

## 2019-10-21 DIAGNOSIS — Z1211 Encounter for screening for malignant neoplasm of colon: Secondary | ICD-10-CM

## 2019-10-21 NOTE — Progress Notes (Signed)
Subjective:   Lauren Eaton is a 73 y.o. female who presents for Medicare Annual (Subsequent) preventive examination.  Virtual Visit via Telephone Note  I connected with Lauren Eaton on 10/21/19 at 10:00 AM EST by telephone and verified that I am speaking with the correct person using two identifiers.  Medicare Annual Wellness visit completed telephonically due to Covid-19 pandemic.   Location: Patient: home Provider: office   I discussed the limitations, risks, security and privacy concerns of performing an evaluation and management service by telephone and the availability of in person appointments. The patient expressed understanding and agreed to proceed.  Some vital signs may be absent or patient reported.   Reather Littler, LPN    Review of Systems:   Cardiac Risk Factors include: advanced age (>66men, >48 women);dyslipidemia     Objective:     Vitals: BP (!) 153/79    Ht 5\' 4"  (1.626 m)    Wt 123 lb (55.8 kg)    BMI 21.11 kg/m   Body mass index is 21.11 kg/m.  Advanced Directives 10/21/2019 06/19/2017 04/01/2017 01/27/2017 08/09/2016 02/13/2016 07/31/2015  Does Patient Have a Medical Advance Directive? No No No No No No No  Would patient like information on creating a medical advance directive? Yes (MAU/Ambulatory/Procedural Areas - Information given) - No - Patient declined - No - patient declined information No - patient declined information No - patient declined information    Tobacco Social History   Tobacco Use  Smoking Status Never Smoker  Smokeless Tobacco Former 08/02/2015   Types: Snuff  Tobacco Comment   Used snuff, dips 3-4 dips/day, has used it 40-50 years. quit 10/2014.     Counseling given: Not Answered Comment: Used snuff, dips 3-4 dips/day, has used it 40-50 years. quit 10/2014.   Clinical Intake:  Pre-visit preparation completed: Yes  Pain : No/denies pain     BMI - recorded: 21.11 Nutritional Status: BMI of 19-24  Normal Nutritional Risks:  None Diabetes: No  How often do you need to have someone help you when you read instructions, pamphlets, or other written materials from your doctor or pharmacy?: 1 - Never  Interpreter Needed?: No  Information entered by :: [email or web address] LPN  Past Medical History:  Diagnosis Date   Abnormal finding on thyroid function test    recheck labs    Abnormal glucose    Chronic insomnia    Dyslipidemia    onset 07/20/10   History of anemia    Hyperlipidemia    Mild cognitive impairment    Osteoporosis    she took forteo in 2012, but has been off medication, bone density is still showing osteoporosis, refer to rheumatologist   Vitamin D deficiency    Past Surgical History:  Procedure Laterality Date   SURGERY OF LIP     Family History  Problem Relation Age of Onset   Alzheimer's disease Mother    Dementia Mother    Early death Father        MVA   CVA Sister 35       complications of   Cancer Brother        stomach   Cancer Brother        lung   Breast cancer Neg Hx    Social History   Socioeconomic History   Marital status: Single    Spouse name: Not on file   Number of children: 1   Years of education: 4   Highest education level: Not on  file  Occupational History   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 quit 10/2014.  Substance and Sexual Activity   Alcohol use: No    Alcohol/week: 0.0 standard drinks   Drug use: No   Sexual activity: Not Currently  Lifestyle   Physical activity    Days per week: 5 days    Minutes per session: 60 min    Stress: Not at all  Relationships   Social connections    Talks on phone: More than three times a week    Gets together: More than three times a week    Attends religious service: More than 4 times per year    Active member of club or organization: No    Attends meetings of clubs or organizations: Never    Relationship status: Widowed  Other Topics Concern   Not on file  Social History Narrative   Lives alone in a senior citizen apartment, has friends and likes to take care of her plants and go out with her friend/neighbors.    Still able to drive - but only grocery stores and church.    Currently watching her great-grandson - infant   Caffeine use: Soda daily   Right handed    Outpatient Encounter Medications as of 10/21/2019  Medication Sig   alendronate (FOSAMAX) 70 MG tablet Take 1 tablet (70 mg total) by mouth once a week. Take with a full glass of water on an empty stomach.   aspirin EC 81 MG tablet Take 1 tablet (81 mg total) by mouth daily.   atorvastatin (LIPITOR) 40 MG tablet Take 1 tablet (40 mg total) by mouth daily.   calcium-vitamin D (SM CALCIUM 500/VITAMIN D3) 500-400 MG-UNIT per tablet Take 1 tablet by mouth daily.   donepezil (ARICEPT) 10 MG tablet Take 1 tablet (10 mg total) by mouth at bedtime.   Multiple Vitamin (MULTI-VITAMINS) TABS Take 1 tablet by mouth daily.   traZODone (DESYREL) 50 MG tablet Take 1 tablet (50 mg total) by mouth at bedtime.   No facility-administered encounter medications on file as of 10/21/2019.     Activities of Daily Living In your present state of health, do you have any difficulty performing the following activities: 10/21/2019 08/11/2019  Hearing? N N  Comment declines hearing aids -  Vision? N Y  Difficulty concentrating or making decisions? Malvin JohnsY Y  Walking or climbing stairs? N N  Dressing or bathing? N N  Doing errands, shopping? N Y  Quarry managerreparing Food and eating ? N -  Using the Toilet? N -  In the past six months, have  you accidently leaked urine? N -  Do you have problems with loss of bowel control? N -  Managing your Medications? N -  Managing your Finances? N -  Housekeeping or managing your Housekeeping? N -  Some recent data might be hidden    Patient Care Team: Alba CorySowles, Krichna, MD as PCP - General (Family Medicine)    Assessment:   This is a routine  wellness examination for Lauren Eaton.  Exercise Activities and Dietary recommendations Current Exercise Habits: Home exercise routine, Type of exercise: calisthenics;stretching;Other - see comments(cardio fitness), Time (Minutes): 60, Frequency (Times/Week): 5, Weekly Exercise (Minutes/Week): 300, Intensity: Moderate, Exercise limited by: None identified  Goals     Patient Stated     Remain healthy and active       Fall Risk Fall Risk  10/21/2019 08/11/2019 08/11/2017 01/27/2017 08/09/2016  Falls in the past year? 0 0 No No No  Number falls in past yr: 0 0 - - -  Injury with Fall? 0 0 - - -  Follow up Falls prevention discussed - - - -   FALL RISK PREVENTION PERTAINING TO THE HOME:  Any stairs in or around the home? Yes  If so, do they handrails? Yes   Home free of loose throw rugs in walkways, pet beds, electrical cords, etc? Yes  Adequate lighting in your home to reduce risk of falls? Yes   ASSISTIVE DEVICES UTILIZED TO PREVENT FALLS:  Life alert? No  Use of a cane, walker or w/c? No  Grab bars in the bathroom? Yes  Shower chair or bench in shower? Yes  Elevated toilet seat or a handicapped toilet? No   DME ORDERS:  DME order needed?  No   TIMED UP AND GO:  Was the test performed? No . Telephonic visit.   Education: Fall risk prevention has been discussed.  Intervention(s) required? No    Depression Screen PHQ 2/9 Scores 10/21/2019 08/11/2019 08/11/2017 01/27/2017  PHQ - 2 Score 0 0 0 0  PHQ- 9 Score - 0 - -     Cognitive Function MMSE - Mini Mental State Exam 04/27/2018 02/16/2018 08/09/2016 08/09/2016  Orientation to time 3 4 -  5  Orientation to Place 3 5 - 5  Registration 3 3 - 3  Attention/ Calculation 2 5 - 4  Recall 2 3 - 3  Language- name 2 objects 2 2 - 2  Language- repeat 1 1 - 1  Language- follow 3 step command 3 3 - 3  Language- read & follow direction 1 1 - 1  Write a sentence 1 1 - 1  Copy design 1 1 0 (No Data)  Copy design-comments - - - 0  Total score 22 29 - -     6CIT Screen 10/21/2019  What Year? 0 points  What month? 0 points  What time? 0 points  Count back from 20 0 points  Months in reverse 4 points  Repeat phrase 4 points  Total Score 8    Immunization History  Administered Date(s) Administered   Fluad Quad(high Dose 65+) 08/11/2019   Influenza, High Dose Seasonal PF 08/11/2017, 09/22/2018   Influenza,inj,Quad PF,6+ Mos 07/31/2015   Influenza-Unspecified 11/11/2016   Pneumococcal Conjugate-13 01/27/2017   Pneumococcal Polysaccharide-23 07/31/2015   Td 07/26/2009   Zoster 07/31/2015   Zoster Recombinat (Shingrix) 10/25/2017    Qualifies for Shingles Vaccine? Yes  Zostavax completed 2016. Shingrix series complete.   Tdap: Although this vaccine is not a covered service during a Wellness Exam, does the patient still wish to receive this vaccine today?  No .  Education has been provided regarding the importance of this vaccine. Advised may receive this vaccine at local pharmacy or Health Dept. Aware to provide a copy of the vaccination record if obtained from local pharmacy or Health Dept. Verbalized acceptance and understanding.  Flu Vaccine: Up to date  Pneumococcal Vaccine: Up to date  Screening Tests Health Maintenance  Topic Date Due   COLONOSCOPY  08/17/2019   TETANUS/TDAP  10/16/2020 (Originally 07/27/2019)   MAMMOGRAM  10/22/2019   INFLUENZA VACCINE  Completed   DEXA SCAN  Completed   Hepatitis C Screening  Completed   PNA vac Low Risk Adult  Completed    Cancer Screenings:  Colorectal Screening: Completed 08/28/2009. Repeat every 10 years.  Referral to GI placed today. Pt aware the office will call re: appt.  Mammogram: Completed 10/21/18. Repeat every year; Scheduled for 10/25/19.  Bone Density: Completed 03/23/15. Results reflect  OSTEOPOROSIS. Repeat every 2 years. Scheduled for 10/25/19.   Lung Cancer Screening: (Low Dose CT Chest recommended if Age 72-80 years, 30 pack-year currently smoking OR have quit w/in 15years.) does not qualify.    Additional Screening:  Hepatitis C Screening: does qualify; Completed 08/11/19  Vision Screening: Recommended annual ophthalmology exams for early detection of glaucoma and other disorders of the eye. Is the patient up to date with their annual eye exam?  Yes  Who is the provider or what is the name of the office in which the pt attends annual eye exams? [facility]  Dental Screening: Recommended annual dental exams for proper oral hygiene  Community Resource Referral:  CRR required this visit?  No      Plan:     I have personally reviewed and addressed the Medicare Annual Wellness questionnaire and have noted the following in the patients chart:  A. Medical and social history B. Use of alcohol, tobacco or illicit drugs  C. Current medications and supplements D. Functional ability and status E.  Nutritional status F.  Physical activity G. Advance directives H. List of other physicians I.  Hospitalizations, surgeries, and ER visits in previous 12 months J.  Vitals K. Screenings such as hearing and vision if needed, cognitive and depression L. Referrals and appointments   In addition, I have reviewed and discussed with patient certain preventive protocols, quality metrics, and best practice recommendations. A written personalized care plan for preventive services as well as general preventive health recommendations were provided to patient.   Signed,  Reather Littler, LPN Nurse Health Advisor   Nurse Notes: pt doing well and appreciative of visit today.

## 2019-10-21 NOTE — Patient Instructions (Signed)
[name] , Thank you for taking time to come for your Medicare Wellness Visit. I appreciate your ongoing commitment to your health goals. Please review the following plan we discussed and let me know if I can assist you in the future.   Screening recommendations/referrals: Colonoscopy: done 08/28/09 Mammogram: done 10/21/18. Scheduled for 10/25/19. Bone Density: scheduled for 10/25/19 Recommended yearly ophthalmology/optometry visit for glaucoma screening and checkup Recommended yearly dental visit for hygiene and checkup  Vaccinations: Influenza vaccine: done 08/11/19 Pneumococcal vaccine: done 01/27/17 Tdap vaccine: done 07/26/2009 Shingles vaccine:Shingrix series completed 10/25/17    Advanced directives: Advance directive discussed with you today. I have provided a copy for you to complete at home and have notarized. Once this is complete please bring a copy in to our office so we can scan it into your chart.  Conditions/risks identified: Keep up the great work!  Next appointment: Please follow up in one year for your Medicare Annual Wellness visit.     Preventive Care 72 Years and Older, Female Preventive care refers to lifestyle choices and visits with your health care provider that can promote health and wellness. What does preventive care include?  A yearly physical exam. This is also called an annual well check.  Dental exams once or twice a year.  Routine eye exams. Ask your health care provider how often you should have your eyes checked.  Personal lifestyle choices, including:  Daily care of your teeth and gums.  Regular physical activity.  Eating a healthy diet.  Avoiding tobacco and drug use.  Limiting alcohol use.  Practicing safe sex.  Taking low-dose aspirin every day.  Taking vitamin and mineral supplements as recommended by your health care provider. What happens during an annual well check? The services and screenings done by your health care  provider during your annual well check will depend on your age, overall health, lifestyle risk factors, and family history of disease. Counseling  Your health care provider may ask you questions about your:  Alcohol use.  Tobacco use.  Drug use.  Emotional well-being.  Home and relationship well-being.  Sexual activity.  Eating habits.  History of falls.  Memory and ability to understand (cognition).  Work and work Statistician.  Reproductive health. Screening  You may have the following tests or measurements:  Height, weight, and BMI.  Blood pressure.  Lipid and cholesterol levels. These may be checked every 5 years, or more frequently if you are over 72 years old.  Skin check.  Lung cancer screening. You may have this screening every year starting at age 72 if you have a 30-pack-year history of smoking and currently smoke or have quit within the past 15 years.  Fecal occult blood test (FOBT) of the stool. You may have this test every year starting at age 72.  Flexible sigmoidoscopy or colonoscopy. You may have a sigmoidoscopy every 5 years or a colonoscopy every 10 years starting at age 72.  Hepatitis C blood test.  Hepatitis B blood test.  Sexually transmitted disease (STD) testing.  Diabetes screening. This is done by checking your blood sugar (glucose) after you have not eaten for a while (fasting). You may have this done every 1-3 years.  Bone density scan. This is done to screen for osteoporosis. You may have this done starting at age 72.  Mammogram. This may be done every 1-2 years. Talk to your health care provider about how often you should have regular mammograms. Talk with your health care provider about  your test results, treatment options, and if necessary, the need for more tests. Vaccines  Your health care provider may recommend certain vaccines, such as:  Influenza vaccine. This is recommended every year.  Tetanus, diphtheria, and acellular  pertussis (Tdap, Td) vaccine. You may need a Td booster every 10 years.  Zoster vaccine. You may need this after age 72.  Pneumococcal 13-valent conjugate (PCV13) vaccine. One dose is recommended after age 72.  Pneumococcal polysaccharide (PPSV23) vaccine. One dose is recommended after age 72. Talk to your health care provider about which screenings and vaccines you need and how often you need them. This information is not intended to replace advice given to you by your health care provider. Make sure you discuss any questions you have with your health care provider. Document Released: 12/29/2015 Document Revised: 08/21/2016 Document Reviewed: 10/03/2015 Elsevier Interactive Patient Education  2017 [place] Prevention in the Home Falls can cause injuries. They can happen to people of all ages. There are many things you can do to make your home safe and to help prevent falls. What can I do on the outside of my home?  Regularly fix the edges of walkways and driveways and fix any cracks.  Remove anything that might make you trip as you walk through a door, such as a raised step or threshold.  Trim any bushes or trees on the path to your home.  Use bright outdoor lighting.  Clear any walking paths of anything that might make someone trip, such as rocks or tools.  Regularly check to see if handrails are loose or broken. Make sure that both sides of any steps have handrails.  Any raised decks and porches should have guardrails on the edges.  Have any leaves, snow, or ice cleared regularly.  Use sand or salt on walking paths during winter.  Clean up any spills in your garage right away. This includes oil or grease spills. What can I do in the bathroom?  Use night lights.  Install grab bars by the toilet and in the tub and shower. Do not use towel bars as grab bars.  Use non-skid mats or decals in the tub or shower.  If you need to sit down in the shower, use a plastic,  non-slip stool.  Keep the floor dry. Clean up any water that spills on the floor as soon as it happens.  Remove soap buildup in the tub or shower regularly.  Attach bath mats securely with double-sided non-slip rug tape.  Do not have throw rugs and other things on the floor that can make you trip. What can I do in the bedroom?  Use night lights.  Make sure that you have a light by your bed that is easy to reach.  Do not use any sheets or blankets that are too big for your bed. They should not hang down onto the floor.  Have a firm chair that has side arms. You can use this for support while you get dressed.  Do not have throw rugs and other things on the floor that can make you trip. What can I do in the kitchen?  Clean up any spills right away.  Avoid walking on wet floors.  Keep items that you use a lot in easy-to-reach places.  If you need to reach something above you, use a strong step stool that has a grab bar.  Keep electrical cords out of the way.  Do not use floor polish or wax  that makes floors slippery. If you must use wax, use non-skid floor wax.  Do not have throw rugs and other things on the floor that can make you trip. What can I do with my stairs?  Do not leave any items on the stairs.  Make sure that there are handrails on both sides of the stairs and use them. Fix handrails that are broken or loose. Make sure that handrails are as long as the stairways.  Check any carpeting to make sure that it is firmly attached to the stairs. Fix any carpet that is loose or worn.  Avoid having throw rugs at the top or bottom of the stairs. If you do have throw rugs, attach them to the floor with carpet tape.  Make sure that you have a light switch at the top of the stairs and the bottom of the stairs. If you do not have them, ask someone to add them for you. What else can I do to help prevent falls?  Wear shoes that:  Do not have high heels.  Have rubber  bottoms.  Are comfortable and fit you well.  Are closed at the toe. Do not wear sandals.  If you use a stepladder:  Make sure that it is fully opened. Do not climb a closed stepladder.  Make sure that both sides of the stepladder are locked into place.  Ask someone to hold it for you, if possible.  Clearly mark and make sure that you can see:  Any grab bars or handrails.  First and last steps.  Where the edge of each step is.  Use tools that help you move around (mobility aids) if they are needed. These include:  Canes.  Walkers.  Scooters.  Crutches.  Turn on the lights when you go into a dark area. Replace any light bulbs as soon as they burn out.  Set up your furniture so you have a clear path. Avoid moving your furniture around.  If any of your floors are uneven, fix them.  If there are any pets around you, be aware of where they are.  Review your medicines with your doctor. Some medicines can make you feel dizzy. This can increase your chance of falling. Ask your doctor what other things that you can do to help prevent falls. This information is not intended to replace advice given to you by your health care provider. Make sure you discuss any questions you have with your health care provider. Document Released: 09/28/2009 Document Revised: 05/09/2016 Document Reviewed: 01/06/2015 Elsevier Interactive Patient Education  2017 [employer].

## 2019-10-25 ENCOUNTER — Ambulatory Visit
Admission: RE | Admit: 2019-10-25 | Discharge: 2019-10-25 | Disposition: A | Discharge status: Home / Self Care | Payer: Medicare Other | Source: Ambulatory Visit | Attending: Family Medicine | Admitting: Family Medicine

## 2019-10-25 DIAGNOSIS — Z780 Asymptomatic menopausal state: Secondary | ICD-10-CM | POA: Diagnosis not present

## 2019-10-25 DIAGNOSIS — Z1231 Encounter for screening mammogram for malignant neoplasm of breast: Secondary | ICD-10-CM | POA: Insufficient documentation

## 2019-10-25 DIAGNOSIS — M810 Age-related osteoporosis without current pathological fracture: Secondary | ICD-10-CM | POA: Insufficient documentation

## 2019-10-27 ENCOUNTER — Other Ambulatory Visit: Payer: Self-pay | Admitting: Family Medicine

## 2019-10-27 DIAGNOSIS — R928 Other abnormal and inconclusive findings on diagnostic imaging of breast: Secondary | ICD-10-CM

## 2019-10-27 DIAGNOSIS — N6310 Unspecified lump in the right breast, unspecified quadrant: Secondary | ICD-10-CM

## 2019-10-31 ENCOUNTER — Other Ambulatory Visit: Payer: Self-pay | Admitting: Family Medicine

## 2019-10-31 DIAGNOSIS — G4709 Other insomnia: Secondary | ICD-10-CM

## 2019-11-05 ENCOUNTER — Ambulatory Visit
Admission: RE | Admit: 2019-11-05 | Discharge: 2019-11-05 | Disposition: A | Discharge status: Home / Self Care | Payer: Medicare Other | Source: Ambulatory Visit | Attending: Family Medicine | Admitting: Family Medicine

## 2019-11-05 ENCOUNTER — Encounter: Payer: Self-pay | Admitting: *Deleted

## 2019-11-05 DIAGNOSIS — R928 Other abnormal and inconclusive findings on diagnostic imaging of breast: Secondary | ICD-10-CM | POA: Insufficient documentation

## 2019-11-05 DIAGNOSIS — R922 Inconclusive mammogram: Secondary | ICD-10-CM | POA: Diagnosis not present

## 2019-11-05 DIAGNOSIS — N6310 Unspecified lump in the right breast, unspecified quadrant: Secondary | ICD-10-CM

## 2019-11-05 DIAGNOSIS — N6313 Unspecified lump in the right breast, lower outer quadrant: Secondary | ICD-10-CM | POA: Diagnosis not present

## 2019-11-08 ENCOUNTER — Other Ambulatory Visit: Payer: Self-pay | Admitting: Family Medicine

## 2019-11-08 DIAGNOSIS — R928 Other abnormal and inconclusive findings on diagnostic imaging of breast: Secondary | ICD-10-CM

## 2019-11-08 DIAGNOSIS — N6310 Unspecified lump in the right breast, unspecified quadrant: Secondary | ICD-10-CM

## 2019-11-09 ENCOUNTER — Telehealth: Payer: Self-pay

## 2019-11-09 NOTE — Telephone Encounter (Signed)
Copied from [place] [phone]. Topic: General - Call Back - No Documentation >> Nov 08, 2019  2:05 PM Erick Blinks wrote: Reason for CRM: Pt's daughter called and is requesting a call back from PCP/ Nurse regarding mammogram. Please advise  Best contact: [phone]

## 2019-11-15 ENCOUNTER — Other Ambulatory Visit: Payer: Self-pay | Admitting: Family Medicine

## 2019-11-15 ENCOUNTER — Encounter: Payer: Self-pay | Admitting: Family Medicine

## 2019-11-15 ENCOUNTER — Other Ambulatory Visit: Payer: Self-pay

## 2019-11-15 ENCOUNTER — Ambulatory Visit (INDEPENDENT_AMBULATORY_CARE_PROVIDER_SITE_OTHER): Payer: Medicare Other | Admitting: Family Medicine

## 2019-11-15 VITALS — BP 121/59 | HR 75

## 2019-11-15 DIAGNOSIS — G3184 Mild cognitive impairment, so stated: Secondary | ICD-10-CM

## 2019-11-15 DIAGNOSIS — E785 Hyperlipidemia, unspecified: Secondary | ICD-10-CM

## 2019-11-15 DIAGNOSIS — R928 Other abnormal and inconclusive findings on diagnostic imaging of breast: Secondary | ICD-10-CM

## 2019-11-15 DIAGNOSIS — M810 Age-related osteoporosis without current pathological fracture: Secondary | ICD-10-CM

## 2019-11-15 DIAGNOSIS — M25571 Pain in right ankle and joints of right foot: Secondary | ICD-10-CM

## 2019-11-15 MED ORDER — MELOXICAM 15 MG PO TABS: 15.0000 mg | ORAL_TABLET | Freq: Every day | ORAL | 0 refills | Status: DC

## 2019-11-15 NOTE — Progress Notes (Signed)
Name: Lauren Eaton   MRN: [id number]    DOB: Oct 11, 1946   Date:11/15/2019       Progress Note  Subjective  Chief Complaint  Chief Complaint  Patient presents with  . Memory Loss    She reports that memory is stable. No new concerns. She continues to take medication as prescribed.  . Dyslipidemia  . Osteoporosis    I connected with  Lauren Eaton  on 11/15/19 at  3:40 PM EST by a video enabled telemedicine application and verified that I am speaking with the correct person using two identifiers.  I discussed the limitations of evaluation and management by telemedicine and the availability of in person appointments. The patient expressed understanding and agreed to proceed. Staff also discussed with the patient that there may be a patient responsible charge related to this service. Patient Location: at home  Provider Location: [facility]  Additional Individuals present: daughter   HPI  Abnormal mammogram: patient is scheduled for breast biopsy on Dec 1 st, 2020   Osteoporosis: seen by Dr. Gavin Eaton n August 2016, and was started on Fosamax, she had a gap, but has been taking it weekly since Feb 2017.She took almost two years of Forteo back in 2012 and 2013. Last bone density was 03/2015 and still showed osteoporosis. She had a repeat bone density done 10/25/2019 and stable. 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 She has never gotten lost and able to take care of great grandson. Lives alone.Marland Kitchen MMSinitially was28 at times of complaints, started on Aricept 5 and level was stable at 29, we did not have time to recheck MMS today . Positive family history of Alzheimer's -  mother, also has a sister with symptoms but not formally diagnosed with dementia. She was getting very frustrated with previous neighbor and was calling wicked, she moved out of the apartment complex, she still states that previous neighbors still bothers her " they have devices and area sill inside of her house" . Daughter obtained paperwork for  power of attorney of health care , she was evaluated by neurologist in the past and no changes in therapy. She will get it filled out    Insomnia: she states she has been sleeping well lately and not taking Trazodone   Right ankle edema: daughter noticed that she started to limp on Thursday during Thanksgiving dinner. Daughter is not sure of the trigger. No redness or increase in warmth, just painful when she stands up and applies pressure and is swollen. Daughter denies any falls or twisting. Daughter gave her ibuprofen on Thursday twice and pain and swelling improved. She is still limping, but not as bad, no doses of nsaid's in the past three days.    Patient Active Problem List   Diagnosis Date Noted  . Behavioral change 08/09/2016  . Memory changes 08/09/2016  . 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 Abnormal thyroid function test 07/20/2010  . Dyslipidemia 07/20/2010  . OP (osteoporosis) 07/26/2009  . History of anemia 07/20/2009    Past Surgical History:  Procedure Laterality Date  . SURGERY OF LIP      Family History  Problem Relation Age of Onset  . Alzheimer's disease Mother   .  Dementia Mother   . Early death Father        MVA  . CVA Sister [phone]       complications of  . Cancer Brother        stomach  . Cancer Brother        lung  . Breast cancer Neg Hx     Social History   Socioeconomic History  . Marital status: Single    Spouse name: Not on file  . Number of children: 1  . Years of education: 4  . Highest education level: Not on file  Occupational History  . 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 Financial resource strain: Not hard at all  . Food insecurity    Worry: Never true    Inability: Never true  . Transportation needs    Medical: No    Non-medical: No  Tobacco Use  . Smoking status: Never Smoker  . Smokeless tobacco: Former NeurosurgeonUser    Types: Snuff  . Tobacco comment: Used snuff, dips 3-4 dips/day, has used it 40-50 years. quit 10/2014.  Substance and Sexual Activity  . Alcohol use: No    Alcohol/week: 0.0 standard drinks  . Drug use: No  . Sexual activity: Not Currently  Lifestyle  . Physical activity    Days per week: 5 days    Minutes per session: 60 min  . Stress: Not at all  Relationships  . Social connections    Talks on phone: More than three times a week    Gets together: More than three times a week    Attends religious service: More than 4 times per year    Active member of club or organization: No    Attends meetings of clubs or organizations: Never    Relationship status: Widowed  . Intimate partner violence    Fear of current or ex partner: No    Emotionally abused: No    Physically abused: No    Forced sexual activity: No  Other Topics Concern  . Not on file  Social History Narrative   Lives alone in a senior citizen apartment, has friends and likes to take care of her plants and go out with her friend/neighbors.    Still able to drive - but only grocery stores and church.    Currently watching her great-grandson - infant   Caffeine use: Soda daily   Right handed     Current Outpatient Medications:  .  alendronate (FOSAMAX) 70 MG tablet, Take 1 tablet (70 mg total) by mouth once a week. Take with a full glass of water on an empty stomach., Disp: 12 tablet, Rfl: 5 .  aspirin EC 81 MG tablet, Take 1 tablet (81 mg total) by mouth daily., Disp: 30 tablet, Rfl: 0 .  atorvastatin (LIPITOR) 40 MG tablet, Take 1 tablet (40 mg total) by mouth daily., Disp: 90 tablet, Rfl: 1 .  calcium-vitamin D (SM CALCIUM 500/VITAMIN  D3) 500-400 MG-UNIT per tablet, Take 1 tablet by mouth daily., Disp: , Rfl:  .  donepezil (ARICEPT) 10 MG tablet, Take 1 tablet (10 mg total) by mouth at bedtime., Disp: 90 tablet, Rfl: 1 .  meloxicam (MOBIC) 15 MG tablet, Take 1 tablet (15 mg total) by mouth daily., Disp: 30 tablet, Rfl: 0 .  Multiple Vitamin (MULTI-VITAMINS) TABS, Take 1 tablet by mouth daily., Disp: , Rfl:  .  traZODone (DESYREL) 50  MG tablet, TAKE 1 TABLET(50 MG) BY MOUTH AT BEDTIME, Disp: 90 tablet, Rfl: 0  No Known Allergies  I personally reviewed active problem list, medication list, allergies, family history, social history, health maintenance with the patient/caregiver today.   ROS  Ten systems reviewed and is negative except as mentioned in HPI   Objective  Virtual encounter, vitals obtained at home  Vitals:   11/15/19 1551  BP: (!) 121/59  Pulse: 75    There is no height or weight on file to calculate BMI.  Physical Exam  Awake, alert and oriented   PHQ2/9: Depression screen [facility] 2/9 11/15/2019 10/21/2019 08/11/2019 08/11/2017 01/27/2017  Decreased Interest 0 0 0 0 0  Down, Depressed, Hopeless 0 0 0 0 0  PHQ - 2 Score 0 0 0 0 0  Altered sleeping 0 - 0 - -  Tired, decreased energy 0 - 0 - -  Change in appetite 0 - 0 - -  Feeling bad or failure about yourself  0 - 0 - -  Trouble concentrating 0 - 0 - -  Moving slowly or fidgety/restless 0 - 0 - -  Suicidal thoughts 0 - 0 - -  PHQ-9 Score 0 - 0 - -  Difficult doing work/chores - - Not difficult at all - -   PHQ-2/9 Result is negative.    Fall Risk: Fall Risk  11/15/2019 10/21/2019 08/11/2019 08/11/2017 01/27/2017  Falls in the past year? 0 0 0 No No  Number falls in past yr: 0 0 0 - -  Injury with Fall? 0 0 0 - -  Follow up - Falls prevention discussed - - -     Assessment & Plan  1. Acute right ankle pain  - meloxicam (MOBIC) 15 MG tablet; Take 1 tablet (15 mg total) by mouth daily.  Dispense: 30 tablet; Refill: 0 Advised to hold and  start medication after procedure tomorrow Call back if no resolution or significant improvement in 7 days, she can try a soft brace , but if not better refer to Ortho and may need X-ray   2. Dyslipidemia   3. Mild cognitive impairment  stable  4. Age-related osteoporosis without current pathological fracture  Reviewed bone density, continue fosamax  5. Abnormal mammogram of left breast  Having biopsy tomorrw  I discussed the assessment and treatment plan with the patient. The patient was provided an opportunity to ask questions and all were answered. The patient agreed with the plan and demonstrated an understanding of the instructions.  The patient was advised to call back or seek an in-person evaluation if the symptoms worsen or if the condition fails to improve as anticipated.  I provided 25  minutes of non-face-to-face time during this encounter.

## 2019-11-16 ENCOUNTER — Ambulatory Visit
Admission: RE | Admit: 2019-11-16 | Discharge: 2019-11-16 | Disposition: A | Discharge status: Home / Self Care | Payer: Medicare Other | Source: Ambulatory Visit | Attending: Family Medicine | Admitting: Family Medicine

## 2019-11-16 DIAGNOSIS — R928 Other abnormal and inconclusive findings on diagnostic imaging of breast: Secondary | ICD-10-CM

## 2019-11-16 DIAGNOSIS — N6313 Unspecified lump in the right breast, lower outer quadrant: Secondary | ICD-10-CM | POA: Diagnosis not present

## 2019-11-16 DIAGNOSIS — N6310 Unspecified lump in the right breast, unspecified quadrant: Secondary | ICD-10-CM | POA: Insufficient documentation

## 2019-11-16 HISTORY — PX: BREAST BIOPSY: SHX20

## 2019-11-17 LAB — SURGICAL PATHOLOGY

## 2019-12-13 ENCOUNTER — Other Ambulatory Visit: Payer: Self-pay | Admitting: Family Medicine

## 2019-12-13 DIAGNOSIS — M25571 Pain in right ankle and joints of right foot: Secondary | ICD-10-CM

## 2020-01-28 ENCOUNTER — Other Ambulatory Visit: Payer: Self-pay | Admitting: Family Medicine

## 2020-01-28 DIAGNOSIS — G3184 Mild cognitive impairment, so stated: Secondary | ICD-10-CM

## 2020-01-28 DIAGNOSIS — E785 Hyperlipidemia, unspecified: Secondary | ICD-10-CM

## 2020-01-28 DIAGNOSIS — G4709 Other insomnia: Secondary | ICD-10-CM

## 2020-01-28 NOTE — Telephone Encounter (Signed)
Requested Prescriptions  Pending Prescriptions Disp Refills  . traZODone (DESYREL) 50 MG tablet [Pharmacy Med Name: TRAZODONE 50MG  TABLETS] 90 tablet 0    Sig: TAKE 1 TABLET(50 MG) BY MOUTH AT BEDTIME     Psychiatry: Antidepressants - Serotonin Modulator Passed - 01/28/2020 10:37 AM      Passed - Valid encounter within last 6 months    Recent Outpatient Visits          2 months ago Acute right ankle pain   [facility], MD   5 months ago Dyslipidemia   [facility], MD   1 year ago Mild cognitive impairment   [facility], MD   2 years ago Mild cognitive impairment   [facility], MD   2 years ago Keloid scar of skin   [facility], FNP      Future Appointments            In 9 months [facility]            . atorvastatin (LIPITOR) 40 MG tablet [Pharmacy Med Name: ATORVASTATIN 40MG  TABLETS] 90 tablet 1    Sig: TAKE 1 TABLET(40 MG) BY MOUTH DAILY     Cardiovascular:  Antilipid - Statins Failed - 01/28/2020 10:37 AM      Failed - Total Cholesterol in normal range and within 360 days    Cholesterol, Total  Date Value Ref Range Status  08/01/2015 208 (H) 100 - 199 mg/dL Final   Cholesterol  Date Value Ref Range Status  08/11/2019 247 (H) <200 mg/dL Final         Failed - LDL in normal range and within 360 days    LDL Cholesterol (Calc)  Date Value Ref Range Status  08/11/2019 140 (H) mg/dL (calc) Final    Comment:    Reference range: <100 . Desirable range <100 mg/dL for primary prevention;   <70 mg/dL for patients with CHD or diabetic patients  with > or = 2 CHD risk factors. 08/13/2019 LDL-C is now calculated using the Martin-Hopkins  calculation, which is a validated novel method providing  better accuracy than the Friedewald equation in the  estimation of LDL-C.  08/13/2019  et al. Marland Kitchen. Horald Pollen): 2061-2068  (http://education.[facility].com/faq/FAQ164)          Failed - Triglycerides in normal range and within 360 days    Triglycerides  Date Value Ref Range Status  08/11/2019 263 (H) <150 mg/dL Final    Comment:    . If a non-fasting specimen was collected, consider repeat triglyceride testing on a fasting specimen if clinically indicated.  08-25-1971 et al. J. of Clin. Lipidol. 2015;9:129-169. 08/13/2019          Passed - HDL in normal range and within 360 days    HDL  Date Value Ref Range Status  08/11/2019 67 > OR = 50 mg/dL Final  Marland Kitchen 39 (L) >39 mg/dL Final    Comment:    According to ATP-III Guidelines, HDL-C >59 mg/dL is considered a negative risk factor for CHD.          Passed - Patient is not pregnant      Passed - Valid encounter within last 12 months    Recent Outpatient Visits          2 months ago Acute right ankle pain   [facility]  Steele Sizer, MD   5 months ago Dyslipidemia   [facility] Steele Sizer, MD   1 year ago Mild cognitive impairment   [facility] Sizer, MD   2 years ago Mild cognitive impairment   [facility] Steele Sizer, MD   2 years ago Keloid scar of skin   [place], FNP      Future Appointments            In 9 months [facility]

## 2020-03-10 ENCOUNTER — Other Ambulatory Visit: Payer: Self-pay | Admitting: Family Medicine

## 2020-03-10 DIAGNOSIS — M25571 Pain in right ankle and joints of right foot: Secondary | ICD-10-CM

## 2020-03-31 ENCOUNTER — Ambulatory Visit: Payer: Medicare Other | Admitting: Family Medicine

## 2020-04-18 ENCOUNTER — Other Ambulatory Visit: Payer: Self-pay

## 2020-04-18 ENCOUNTER — Ambulatory Visit (INDEPENDENT_AMBULATORY_CARE_PROVIDER_SITE_OTHER): Payer: Medicare Other | Admitting: Family Medicine

## 2020-04-18 ENCOUNTER — Encounter: Payer: Self-pay | Admitting: Family Medicine

## 2020-04-18 VITALS — BP 136/90 | HR 73 | Temp 96.8°F | Resp 16 | Ht 64.0 in | Wt 129.3 lb

## 2020-04-18 DIAGNOSIS — E038 Other specified hypothyroidism: Secondary | ICD-10-CM

## 2020-04-18 DIAGNOSIS — R030 Elevated blood-pressure reading, without diagnosis of hypertension: Secondary | ICD-10-CM | POA: Diagnosis not present

## 2020-04-18 DIAGNOSIS — R928 Other abnormal and inconclusive findings on diagnostic imaging of breast: Secondary | ICD-10-CM

## 2020-04-18 DIAGNOSIS — G3184 Mild cognitive impairment, so stated: Secondary | ICD-10-CM | POA: Diagnosis not present

## 2020-04-18 DIAGNOSIS — E039 Hypothyroidism, unspecified: Secondary | ICD-10-CM | POA: Diagnosis not present

## 2020-04-18 DIAGNOSIS — E785 Hyperlipidemia, unspecified: Secondary | ICD-10-CM

## 2020-04-18 DIAGNOSIS — M810 Age-related osteoporosis without current pathological fracture: Secondary | ICD-10-CM

## 2020-04-18 DIAGNOSIS — R442 Other hallucinations: Secondary | ICD-10-CM

## 2020-04-18 DIAGNOSIS — Z1211 Encounter for screening for malignant neoplasm of colon: Secondary | ICD-10-CM

## 2020-04-18 NOTE — Addendum Note (Signed)
Addended by: [name] F on: 04/18/2020 04:46 PM   Modules accepted: Orders

## 2020-04-18 NOTE — Progress Notes (Addendum)
Name: Lauren Eaton   MRN: [id number]    DOB: 10/12/1946   Date:04/18/2020       Progress Note  Subjective  Chief Complaint  Chief Complaint  Patient presents with  . Medication Refill  . Hyperlipidemia  . Memory loss/change in behavior  . Insomnia  . Osteoporosis    HPI  Abnormal mammogram: patient is scheduled for breast biopsy on Dec 1 st, 2020 , negative biopsy   Osteoporosis: seen by Dr. Jefm Bryant n August 2016, and was started on Fosamax, she had a gap, but has been taking it weekly since Feb 2017.She took almost two years of Forteo back in 2012 and 2013. Last bone density was 03/2015 and still showed osteoporosis. She had a repeat bone density done 10/25/2019 and stable. She resumed Fosamax Fall 2020 , tolerating medication well.   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 Lives alone.Marland Kitchen MMSinitially was29 at times of complaints, started on Aricept 5but now on 10 mg and per daughter symptoms stable. Positive family history of Alzheimer's - mother, also has a sister with symptoms but not formally diagnosed with dementia.She was getting very frustrated with previous neighbor and was calling wicked, she moved out of the apartment complex, she still states that previous neighbors still bothers her " they have devices and area sill inside of her house" . Daughter obtained paperwork for  power of attorney of health care , she was evaluated by neurologist in the past and no changes in therapy.   Insomnia: she states she has been sleeping well lately , she states not taking medication lately since sleeping well   Elevated TSH: below 10 and sometimes normal,  possible sub clinical hypothyroidism, no weight gain, fatigue or change in bowel movements   Elevated BP: it has been elevated over the past few visits, improves with rest, advised daughter to check her bp at home and let us know if above 140/90 consistently  Patient Active Problem List   Diagnosis Date Noted  . Behavioral change 08/09/2016  . Memory changes 08/09/2016  . 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 Abnormal thyroid function test 07/20/2010  . Dyslipidemia 07/20/2010  . OP (osteoporosis) 07/26/2009  . History of anemia 07/20/2009    Past Surgical History:  Procedure Laterality Date  . BREAST BIOPSY Right 11/16/2019   rt mass stereo bx path pending x clip  . SURGERY OF LIP      Family History  Problem Relation Age of Onset  . Alzheimer's disease Mother   . Dementia Mother   . Early death Father        MVA  . CVA Sister 74       complications of  . Cancer Brother        stomach  . Cancer Brother        lung  . Breast cancer Neg Hx     Social History   Tobacco Use  . Smoking status: Never Smoker  . Smokeless tobacco: Former Systems developer    Types: Snuff  . Tobacco comment: Used snuff, dips 3-4 dips/day, has used it 40-50 years. quit 10/2014.  Substance Use Topics  . Alcohol use: No    Alcohol/week: 0.0 standard drinks  Current Outpatient Medications:  .  alendronate (FOSAMAX) 70 MG tablet, Take 1 tablet (70 mg total) by mouth once a week. Take with a full glass of water on an empty stomach., Disp: 12 tablet, Rfl: 5 .  aspirin EC 81 MG tablet, Take 1 tablet (81 mg total) by mouth daily., Disp: 30 tablet, Rfl: 0 .  atorvastatin (LIPITOR) 40 MG tablet, TAKE 1 TABLET(40 MG) BY MOUTH DAILY, Disp: 90 tablet, Rfl: 1 .  calcium-vitamin D (SM CALCIUM 500/VITAMIN D3) 500-400 MG-UNIT per tablet, Take 1 tablet by mouth daily., Disp: , Rfl:  .  donepezil (ARICEPT) 10 MG tablet, TAKE 1 TABLET(10 MG) BY MOUTH AT BEDTIME, Disp: 90 tablet, Rfl: 1 .  meloxicam (MOBIC) 15 MG tablet, TAKE 1  TABLET(15 MG) BY MOUTH DAILY, Disp: 90 tablet, Rfl: 0 .  Multiple Vitamin (MULTI-VITAMINS) TABS, Take 1 tablet by mouth daily., Disp: , Rfl:  .  traZODone (DESYREL) 50 MG tablet, TAKE 1 TABLET(50 MG) BY MOUTH AT BEDTIME, Disp: 90 tablet, Rfl: 1  No Known Allergies  I personally reviewed active problem list, medication list, allergies, family history, social history, health maintenance with the patient/caregiver today.   ROS  Constitutional: Negative for fever or significant  weight change.  Respiratory: Negative for cough and shortness of breath.   Cardiovascular: Negative for chest pain or palpitations.  Gastrointestinal: Negative for abdominal pain, no bowel changes.  Musculoskeletal: Negative for gait problem or joint swelling.  Skin: Negative for rash.  Neurological: Negative for dizziness or headache.  No other specific complaints in a complete review of systems (except as listed in HPI above).  Objective  Vitals:   04/18/20 1609 04/18/20 1619  BP: (!) 168/90 136/90  Pulse: 73   Resp: 16   Temp: (!) 96.8 F (36 C)   TempSrc: Temporal   SpO2: 99%   Weight: 129 lb 4.8 oz (58.7 kg)   Height: 5\' 4"  (1.626 m)     Body mass index is 22.19 kg/m.  Physical Exam  Constitutional: Patient appears well-developed and well-nourished.  No distress.  HEENT: head atraumatic, normocephalic, pupils equal and reactive to light, no thyromegaly  Cardiovascular: Normal rate, regular rhythm and normal heart sounds.  No murmur heard. No BLE edema. Pulmonary/Chest: Effort normal and breath sounds normal. No respiratory distress. Abdominal: Soft.  There is no tenderness. Psychiatric: Patient has a normal mood and affect. behavior is normal. Judgment and thought content normal.   PHQ2/9: Depression screen [facility] 2/9 04/18/2020 11/15/2019 10/21/2019 08/11/2019 08/11/2017  Decreased Interest 0 0 0 0 0  Down, Depressed, Hopeless 0 0 0 0 0  PHQ - 2 Score 0 0 0 0 0  Altered sleeping 0 0 - 0 -   Tired, decreased energy 0 0 - 0 -  Change in appetite 0 0 - 0 -  Feeling bad or failure about yourself  0 0 - 0 -  Trouble concentrating 0 0 - 0 -  Moving slowly or fidgety/restless 0 0 - 0 -  Suicidal thoughts 0 0 - 0 -  PHQ-9 Score 0 0 - 0 -  Difficult doing work/chores Not difficult at all - - Not difficult at all -    phq 9 is negative   Fall Risk: Fall Risk  04/18/2020 11/15/2019 10/21/2019 08/11/2019 08/11/2017  Falls in the past year? 0 0 0 0 No  Number falls in past yr: 0 0 0 0 -  Injury with Fall? 0 0 0 0 -  Follow up - -  Falls prevention discussed - -     Functional Status Survey: Is the patient deaf or have difficulty hearing?: No Does the patient have difficulty seeing, even when wearing glasses/contacts?: No Does the patient have difficulty concentrating, remembering, or making decisions?: Yes Does the patient have difficulty walking or climbing stairs?: No Does the patient have difficulty dressing or bathing?: No Does the patient have difficulty doing errands alone such as visiting a doctor's office or shopping?: Yes    Assessment & Plan  1. 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 Age-related osteoporosis without current pathological fracture  Continue medication  3. Dyslipidemia  Discussed healthy diet   4. Abnormal mammogram of left breast  Negative biopsy   5. Mild cognitive impairment  Stable on medication   6. Other hallucinations   7. Subclinical hypothyroidism  Recheck level today   8. Colon cancer screening  - Ambulatory referral to Gastroenterology

## 2020-04-19 LAB — TSH: TSH: 7.63 mIU/L — ABNORMAL HIGH (ref 0.40–4.50)

## 2020-04-21 ENCOUNTER — Other Ambulatory Visit: Payer: Self-pay | Admitting: Family Medicine

## 2020-04-21 DIAGNOSIS — E039 Hypothyroidism, unspecified: Secondary | ICD-10-CM

## 2020-04-21 DIAGNOSIS — E038 Other specified hypothyroidism: Secondary | ICD-10-CM

## 2020-04-21 MED ORDER — LEVOTHYROXINE SODIUM 25 MCG PO TABS: 25.0000 ug | ORAL_TABLET | ORAL | 0 refills | Status: DC

## 2020-05-22 ENCOUNTER — Telehealth: Payer: Self-pay

## 2020-05-22 DIAGNOSIS — R928 Other abnormal and inconclusive findings on diagnostic imaging of breast: Secondary | ICD-10-CM

## 2020-05-22 NOTE — Telephone Encounter (Signed)
Copied from CRM [id number]. Topic: General - Other >> May 22, 2020  3:35 PM Randol Kern wrote: Reason for CRM: Pt called requesting that office send over information needed for her breast exam at "[facility]" Best contact: [phone]

## 2020-05-23 NOTE — Telephone Encounter (Signed)
Patient and daughter have been notified to call and schedule appt.

## 2020-06-06 ENCOUNTER — Other Ambulatory Visit: Payer: Self-pay | Admitting: Family Medicine

## 2020-06-06 DIAGNOSIS — M25571 Pain in right ankle and joints of right foot: Secondary | ICD-10-CM

## 2020-06-09 DIAGNOSIS — E039 Hypothyroidism, unspecified: Secondary | ICD-10-CM | POA: Diagnosis not present

## 2020-06-10 LAB — TSH: TSH: 5.13 mIU/L — ABNORMAL HIGH (ref 0.40–4.50)

## 2020-06-12 ENCOUNTER — Telehealth: Payer: Self-pay

## 2020-06-12 NOTE — Telephone Encounter (Signed)
Patient is calling back for her lab results.  Please advise CB- [phone]

## 2020-06-12 NOTE — Progress Notes (Signed)
Called to speak with patient. No answer. Left voicemail. Will try again later today.

## 2020-06-12 NOTE — Telephone Encounter (Signed)
-----   Message from Lauren Cory, MD sent at 06/11/2020  8:44 AM EDT ----- TSH is still a little elevated, please verify how she is taking levothyroxine before I adjust dose. Make sure she has not been skipping doses.

## 2020-06-13 ENCOUNTER — Other Ambulatory Visit: Payer: Self-pay | Admitting: Family Medicine

## 2020-06-13 DIAGNOSIS — E038 Other specified hypothyroidism: Secondary | ICD-10-CM

## 2020-06-13 DIAGNOSIS — E039 Hypothyroidism, unspecified: Secondary | ICD-10-CM

## 2020-06-13 MED ORDER — LEVOTHYROXINE SODIUM 25 MCG PO TABS: 25.0000 ug | ORAL_TABLET | ORAL | 0 refills | Status: DC

## 2020-06-13 NOTE — Telephone Encounter (Signed)
Patient states she is taking her medication as prescribed. One tablet before breakfast. She denies skipping any doses.

## 2020-06-14 NOTE — Telephone Encounter (Signed)
Called patient . She verbalized understanding.

## 2020-06-23 ENCOUNTER — Ambulatory Visit
Admission: RE | Admit: 2020-06-23 | Discharge: 2020-06-23 | Disposition: A | Discharge status: Home / Self Care | Payer: Medicare Other | Source: Ambulatory Visit | Attending: Family Medicine | Admitting: Family Medicine

## 2020-06-23 DIAGNOSIS — R928 Other abnormal and inconclusive findings on diagnostic imaging of breast: Secondary | ICD-10-CM | POA: Diagnosis not present

## 2020-06-23 DIAGNOSIS — R922 Inconclusive mammogram: Secondary | ICD-10-CM | POA: Diagnosis not present

## 2020-07-14 ENCOUNTER — Other Ambulatory Visit: Payer: Self-pay | Admitting: Family Medicine

## 2020-07-14 DIAGNOSIS — E785 Hyperlipidemia, unspecified: Secondary | ICD-10-CM

## 2020-07-24 ENCOUNTER — Other Ambulatory Visit: Payer: Self-pay | Admitting: Family Medicine

## 2020-07-24 DIAGNOSIS — G3184 Mild cognitive impairment, so stated: Secondary | ICD-10-CM

## 2020-08-02 ENCOUNTER — Other Ambulatory Visit: Payer: Self-pay | Admitting: Family Medicine

## 2020-08-02 DIAGNOSIS — G4709 Other insomnia: Secondary | ICD-10-CM

## 2020-08-02 NOTE — Telephone Encounter (Signed)
Approved per protocol.  Requested Prescriptions  Pending Prescriptions Disp Refills  . traZODone (DESYREL) 50 MG tablet [Pharmacy Med Name: TRAZODONE 50MG  TABLETS] 90 tablet 0    Sig: TAKE 1 TABLET(50 MG) BY MOUTH AT BEDTIME     Psychiatry: Antidepressants - Serotonin Modulator Passed - 08/02/2020  6:38 PM      Passed - Valid encounter within last 6 months    Recent Outpatient Visits          3 months ago White coat syndrome without diagnosis of hypertension   [facility], Leugnies, MD   8 months ago Acute right ankle pain   [facility], MD   11 months ago Dyslipidemia   [facility], MD   2 years ago Mild cognitive impairment   [facility], MD   2 years ago Mild cognitive impairment   [facility], MD      Future Appointments            In 2 months [name], Carlynn Purl, MD [facility]   In 2 months  [facility]

## 2020-08-18 DIAGNOSIS — L249 Irritant contact dermatitis, unspecified cause: Secondary | ICD-10-CM | POA: Diagnosis not present

## 2020-08-23 ENCOUNTER — Other Ambulatory Visit: Payer: Self-pay | Admitting: Family Medicine

## 2020-08-23 DIAGNOSIS — E039 Hypothyroidism, unspecified: Secondary | ICD-10-CM

## 2020-09-02 ENCOUNTER — Other Ambulatory Visit: Payer: Self-pay | Admitting: Family Medicine

## 2020-09-02 DIAGNOSIS — M25571 Pain in right ankle and joints of right foot: Secondary | ICD-10-CM

## 2020-09-02 NOTE — Telephone Encounter (Signed)
Requested Prescriptions  Pending Prescriptions Disp Refills  . meloxicam (MOBIC) 15 MG tablet [Pharmacy Med Name: MELOXICAM 15MG  TABLETS] 90 tablet 0    Sig: TAKE 1 TABLET(15 MG) BY MOUTH DAILY     Analgesics:  COX2 Inhibitors Failed - 09/02/2020 10:19 AM      Failed - HGB in normal range and within 360 days    Hemoglobin  Date Value Ref Range Status  08/11/2019 12.4 11.7 - 15.5 g/dL Final  08/13/2019 13/07/6577 11.1 - 15.9 g/dL Final         Failed - Cr in normal range and within 360 days    Creat  Date Value Ref Range Status  08/11/2019 0.78 0.60 - 0.93 mg/dL Final    Comment:    For patients >78 years of age, the reference limit for Creatinine is approximately 13% higher for people identified as African-American. .    Creatinine, Urine  Date Value Ref Range Status  08/11/2019 66 20 - 275 mg/dL Final         Passed - Patient is not pregnant      Passed - Valid encounter within last 12 months    Recent Outpatient Visits          4 months ago White coat syndrome without diagnosis of hypertension   [facility] [place], Leugnies, MD   9 months ago Acute right ankle pain   [facility], MD   1 year ago Dyslipidemia   [facility], MD   2 years ago Mild cognitive impairment   [facility], MD   3 years ago Mild cognitive impairment   [facility], MD      Future Appointments            In 1 month [name], Carlynn Purl, MD [facility], [facility]   In 1 month  [facility]

## 2020-09-28 ENCOUNTER — Ambulatory Visit (INDEPENDENT_AMBULATORY_CARE_PROVIDER_SITE_OTHER): Payer: Medicare Other | Admitting: Podiatry

## 2020-09-28 ENCOUNTER — Other Ambulatory Visit: Payer: Self-pay

## 2020-09-28 ENCOUNTER — Encounter: Payer: Self-pay | Admitting: Podiatry

## 2020-09-28 DIAGNOSIS — I872 Venous insufficiency (chronic) (peripheral): Secondary | ICD-10-CM

## 2020-09-28 MED ORDER — DOXYCYCLINE HYCLATE 100 MG PO TABS
100.0000 mg | ORAL_TABLET | Freq: Two times a day (BID) | ORAL | 0 refills | Status: DC
Start: 2020-09-28 — End: 2020-10-20

## 2020-09-29 ENCOUNTER — Encounter: Payer: Self-pay | Admitting: Podiatry

## 2020-09-29 NOTE — Progress Notes (Signed)
Subjective:  Patient ID: Lauren Eaton, female    DOB: 1946-03-17,  MRN: [id number]  Chief Complaint  Patient presents with  . Rash    Patient presents today for rash and swelling to left foot/ankle x 2 weeks.  She says its very itchy and starting to go up leg but denies any pain with walking or palpation    74 y.o. female presents with the above complaint.  Patient presents with complaint of swelling and redness to the lower leg.  Patient states been going on for 2 weeks.  There is some itchiness associated with it.  She does not have any pain when walking or ambulating.  She is able to walk more than a few blocks without resting.  She states that this came out of nowhere she has not tried anything for it.  She has tried some over-the-counter hydrocortisone cream which has not helped.  She denies any other acute complaints she has not seen anyone else prior to see me.   Review of Systems: Negative except as noted in the HPI. Denies N/V/F/Ch.  Past Medical History:  Diagnosis Date  . Abnormal finding on thyroid function test    recheck labs   . Abnormal glucose   . Chronic insomnia   . Dyslipidemia    onset 07/20/10  . History of anemia   . Hyperlipidemia   . Mild cognitive impairment   . Osteoporosis    she took forteo in 2012, but has been off medication, bone density is still showing osteoporosis, refer to rheumatologist  . Vitamin D deficiency     Current Outpatient Medications:  .  alendronate (FOSAMAX) 70 MG tablet, Take 1 tablet (70 mg total) by mouth once a week. Take with a full glass of water on an empty stomach., Disp: 12 tablet, Rfl: 5 .  aspirin EC 81 MG tablet, Take 1 tablet (81 mg total) by mouth daily., Disp: 30 tablet, Rfl: 0 .  atorvastatin (LIPITOR) 40 MG tablet, TAKE 1 TABLET(40 MG) BY MOUTH DAILY, Disp: 90 tablet, Rfl: 0 .  calcium-vitamin D (SM CALCIUM 500/VITAMIN D3) 500-400 MG-UNIT per tablet, Take 1 tablet by mouth daily., Disp: , Rfl:  .  donepezil  (ARICEPT) 10 MG tablet, TAKE 1 TABLET(10 MG) BY MOUTH AT BEDTIME, Disp: 90 tablet, Rfl: 1 .  doxycycline (VIBRA-TABS) 100 MG tablet, Take 1 tablet (100 mg total) by mouth 2 (two) times daily., Disp: 20 tablet, Rfl: 0 .  levothyroxine (SYNTHROID) 25 MCG tablet, TAKE 1 TABLET BY MOUTH THREE TIMES A WEEK, Disp: 48 tablet, Rfl: 0 .  meloxicam (MOBIC) 15 MG tablet, TAKE 1 TABLET(15 MG) BY MOUTH DAILY, Disp: 90 tablet, Rfl: 0 .  Multiple Vitamin (MULTI-VITAMINS) TABS, Take 1 tablet by mouth daily., Disp: , Rfl:  .  triamcinolone ointment (KENALOG) 0.1 %, SMARTSIG:Sparingly Topical Twice Daily PRN, Disp: , Rfl:   Social History   Tobacco Use  Smoking Status Never Smoker  Smokeless Tobacco Former Neurosurgeon  . Types: Snuff  Tobacco Comment   Used snuff, dips 3-4 dips/day, has used it 40-50 years. quit 10/2014.    No Known Allergies Objective:  There were no vitals filed for this visit. There is no height or weight on file to calculate BMI. Constitutional Well developed. Well nourished.  Vascular Dorsalis pedis pulses palpable bilaterally. Posterior tibial pulses palpable bilaterally. Capillary refill normal to all digits.  No cyanosis or clubbing noted. Pedal hair growth normal.  Neurologic Normal speech. Oriented to person, place, and time. Epicritic  sensation to light touch grossly present bilaterally.  Dermatologic  1+ pitting edema noted to left lower extremity with generalized erythema to the distal leg noted.  No ulcerations present.  No area of fluctuance present.  No history of injury.  Orthopedic: Normal joint ROM without pain or crepitus bilaterally. No visible deformities. No bony tenderness.   Radiographs: None Assessment:   1. Venous stasis dermatitis of left lower extremity    Plan:  Patient was evaluated and treated and all questions answered.  Venous stasis dermatitis/erythema -I explained to the patient the etiology of dermatitis and various treatment options were  discussed.  I discussed with the patient importance of elevation and compression to reduce the amount of swelling especially if she has been in a dependent position for a long period of time.  Patient states understanding she will want to wear compression socks and will elevate.  Ultimately I believe that patient does have a small slight amount of skin and soft tissue infection likely due to edema.  I believe this should resolve with a course of doxycycline. -Doxycycline was sent to the pharmacy for 10 days.  I have instructed her to complete the entire course.  Patient states understanding  No follow-ups on file.

## 2020-10-01 ENCOUNTER — Other Ambulatory Visit: Payer: Self-pay | Admitting: Family Medicine

## 2020-10-01 DIAGNOSIS — M810 Age-related osteoporosis without current pathological fracture: Secondary | ICD-10-CM

## 2020-10-01 NOTE — Telephone Encounter (Signed)
Requested Prescriptions  Pending Prescriptions Disp Refills  . alendronate (FOSAMAX) 70 MG tablet [Pharmacy Med Name: ALENDRONATE 70MG  TABLETS] 12 tablet 5    Sig: TAKE 1 TABLET(70 MG) BY MOUTH 1 TIME A WEEK WITH A FULL GLASS OF WATER AND ON AN EMPTY STOMACH     Endocrinology:  Bisphosphonates Failed - 10/01/2020 11:20 AM      Failed - Ca in normal range and within 360 days    Calcium  Date Value Ref Range Status  08/11/2019 9.8 8.6 - 10.4 mg/dL Final   Calcium, Total  Date Value Ref Range Status  04/22/2014 7.5 (L) 8.5 - 10.1 mg/dL Final         Failed - Vitamin D in normal range and within 360 days    Vit D, 25-Hydroxy  Date Value Ref Range Status  08/11/2019 51 30 - 100 ng/mL Final    Comment:    Vitamin D Status         25-OH Vitamin D: . Deficiency:                    <20 ng/mL Insufficiency:             20 - 29 ng/mL Optimal:                 > or = 30 ng/mL . For 25-OH Vitamin D testing on patients on  D2-supplementation and patients for whom quantitation  of D2 and D3 fractions is required, the [employer](TM) 25-OH VIT D, (D2,D3), LC/MS/MS is recommended: order  code 08/13/2019 (patients >59yrs). See Note 1 . Note 1 . For additional information, please refer to  http://education.Quest[employer]ostics.com/faq/FAQ199  (This link is being provided for informational/ educational purposes only.)          Passed - Valid encounter within last 12 months    Recent Outpatient Visits          5 months ago White coat syndrome without diagnosis of hypertension   [facility], Leugnies, MD   10 months ago Acute right ankle pain   [facility], MD   1 year ago Dyslipidemia   [facility], MD   2 years ago Mild cognitive impairment   [facility], MD   3 years ago Mild cognitive impairment   [facility], MD       Future Appointments            In 2 weeks [name], MD [facility], [facility]   In 3 weeks  [facility]

## 2020-10-11 ENCOUNTER — Other Ambulatory Visit: Payer: Self-pay | Admitting: Family Medicine

## 2020-10-11 DIAGNOSIS — E785 Hyperlipidemia, unspecified: Secondary | ICD-10-CM

## 2020-10-19 ENCOUNTER — Ambulatory Visit: Payer: Medicare Other | Admitting: Podiatry

## 2020-10-20 ENCOUNTER — Encounter: Payer: Self-pay | Admitting: Family Medicine

## 2020-10-20 ENCOUNTER — Ambulatory Visit (INDEPENDENT_AMBULATORY_CARE_PROVIDER_SITE_OTHER): Payer: Medicare Other | Admitting: Family Medicine

## 2020-10-20 ENCOUNTER — Other Ambulatory Visit: Payer: Self-pay

## 2020-10-20 VITALS — BP 140/86 | HR 73 | Temp 98.0°F | Resp 16 | Ht 64.0 in | Wt 128.6 lb

## 2020-10-20 DIAGNOSIS — Z23 Encounter for immunization: Secondary | ICD-10-CM

## 2020-10-20 DIAGNOSIS — Z1211 Encounter for screening for malignant neoplasm of colon: Secondary | ICD-10-CM

## 2020-10-20 DIAGNOSIS — G3184 Mild cognitive impairment, so stated: Secondary | ICD-10-CM

## 2020-10-20 DIAGNOSIS — E039 Hypothyroidism, unspecified: Secondary | ICD-10-CM

## 2020-10-20 DIAGNOSIS — M810 Age-related osteoporosis without current pathological fracture: Secondary | ICD-10-CM

## 2020-10-20 DIAGNOSIS — I872 Venous insufficiency (chronic) (peripheral): Secondary | ICD-10-CM

## 2020-10-20 DIAGNOSIS — E785 Hyperlipidemia, unspecified: Secondary | ICD-10-CM

## 2020-10-20 DIAGNOSIS — Z79899 Other long term (current) drug therapy: Secondary | ICD-10-CM | POA: Diagnosis not present

## 2020-10-20 MED ORDER — DONEPEZIL HCL 10 MG PO TABS: 10.0000 mg | ORAL_TABLET | Freq: Every day | ORAL | 1 refills | Status: DC

## 2020-10-20 MED ORDER — ATORVASTATIN CALCIUM 40 MG PO TABS: 40.0000 mg | ORAL_TABLET | Freq: Every day | ORAL | 1 refills | Status: DC

## 2020-10-20 NOTE — Progress Notes (Signed)
Name: Lauren Eaton   MRN: [id number]    DOB: 1945-12-21   Date:10/20/2020       Progress Note  Subjective  Chief Complaint  Follow up  HPI    Osteoporosis: seen by Dr. Gavin Potters n August 2016, and was started on Fosamax, she had a gap, but has been taking it weekly since Feb 2017.She took almost two years of Forteo back in 2012 and 2013. Last bone density was 03/2015 and still showed osteoporosis. She had a repeat bone density done 10/25/2019 and stable. 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 Lives alone.Marland Kitchen MMSinitially was29 at times of complaints, started on Aricept 5but now on 10 mg and per daughter symptoms stable. Positive family history of Alzheimer's - mother, also has a sister with symptoms but not formally diagnosed with dementia.She was getting very frustrated with previous neighbor and was calling wicked, she moved out of the apartment complex, she initially still complained that  previous neighbors were bothering her  " they have devices and area sill inside of her house" , but now she seems to not be complaining of that anymore. Taking medication and no side effects.   Insomnia: she states she has been sleeping well lately , she has been off medication and not problem   Hypothyroidism: taking levothyroxine now and we will recheck level  Elevated BP: bp is still borderline today but improved, at home bp has been at goal 120's   Patient Active Problem List   Diagnosis Date Noted  . Behavioral change 08/09/2016  . Memory changes 08/09/2016  . 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  Abnormal thyroid function test 07/20/2010  . Dyslipidemia 07/20/2010  . OP (osteoporosis) 07/26/2009  . History of anemia 07/20/2009    Past Surgical History:  Procedure Laterality Date  . BREAST BIOPSY Right 11/16/2019   rt mass stereo bx path pending x clip  . SURGERY OF LIP      Family History  Problem Relation Age of Onset  . Alzheimer's disease Mother   . Dementia Mother   . Early death Father        MVA  . CVA Sister 57       complications of  . Cancer Brother        stomach  . Cancer Brother        lung  . Breast cancer Neg Hx     Social History   Tobacco Use  . Smoking status: Never Smoker  . Smokeless tobacco: Former Neurosurgeon    Types: Snuff  . Tobacco comment: Used snuff, dips 3-4 dips/day, has used it 40-50 years. quit 10/2014.  Substance Use Topics  . Alcohol use: No    Alcohol/week: 0.0 standard drinks     Current Outpatient Medications:  .  alendronate (FOSAMAX) 70 MG tablet, TAKE 1 TABLET(70 MG) BY MOUTH 1 TIME A WEEK WITH A FULL GLASS OF WATER AND ON AN EMPTY STOMACH, Disp: 12 tablet, Rfl: 5 .  aspirin EC 81 MG tablet, Take 1 tablet (81 mg total) by mouth daily., Disp: 30 tablet, Rfl: 0 .  atorvastatin (LIPITOR) 40 MG  tablet, Take 1 tablet (40 mg total) by mouth daily., Disp: 90 tablet, Rfl: 1 .  calcium-vitamin D (SM CALCIUM 500/VITAMIN D3) 500-400 MG-UNIT per tablet, Take 1 tablet by mouth daily., Disp: , Rfl:  .  levothyroxine (SYNTHROID) 25 MCG tablet, TAKE 1 TABLET BY MOUTH THREE TIMES A WEEK, Disp: 48 tablet, Rfl: 0 .  Multiple Vitamin (MULTI-VITAMINS) TABS, Take 1 tablet by mouth daily., Disp: , Rfl:  .  donepezil (ARICEPT) 10 MG tablet, Take 1 tablet (10 mg total) by mouth at bedtime., Disp: 90 tablet, Rfl: 1 .  meloxicam (MOBIC) 15 MG tablet, TAKE 1 TABLET(15 MG) BY MOUTH DAILY (Patient not taking: Reported on 10/20/2020), Disp: 90 tablet, Rfl: 0  No Known Allergies  I personally reviewed active problem list, medication list, allergies, family  history, social history, health maintenance with the patient/caregiver today.   ROS  Constitutional: Negative for fever or weight change.  Respiratory: Negative for cough and shortness of breath.   Cardiovascular: Negative for chest pain or palpitations.  Gastrointestinal: Negative for abdominal pain, no bowel changes.  Musculoskeletal: Negative for gait problem or joint swelling.  Skin: Negative for rash.  Neurological: Negative for dizziness or headache.  No other specific complaints in a complete review of systems (except as listed in HPI above).  Objective  Vitals:   10/20/20 1527  BP: 140/86  Pulse: 73  Resp: 16  Temp: 98 F (36.7 C)  TempSrc: Oral  SpO2: 100%  Weight: 128 lb 9.6 oz (58.3 kg)  Height: 5\' 4"  (1.626 m)    Body mass index is 22.07 kg/m.  Physical Exam  Constitutional: Patient appears well-developed and well-nourished.  No distress.  HEENT: head atraumatic, normocephalic, pupils equal and reactive to light, e neck supple Cardiovascular: Normal rate, regular rhythm and normal heart sounds.  No murmur heard. No BLE edema. Pulmonary/Chest: Effort normal and breath sounds normal. No respiratory distress. Abdominal: Soft.  There is no tenderness. Psychiatric: Patient has a normal mood and affect. behavior is normal. Judgment and thought content normal.  PHQ2/9: Depression screen [facility] 2/9 10/20/2020 04/18/2020 11/15/2019 10/21/2019 08/11/2019  Decreased Interest 0 0 0 0 0  Down, Depressed, Hopeless 0 0 0 0 0  PHQ - 2 Score 0 0 0 0 0  Altered sleeping - 0 0 - 0  Tired, decreased energy - 0 0 - 0  Change in appetite - 0 0 - 0  Feeling bad or failure about yourself  - 0 0 - 0  Trouble concentrating - 0 0 - 0  Moving slowly or fidgety/restless - 0 0 - 0  Suicidal thoughts - 0 0 - 0  PHQ-9 Score - 0 0 - 0  Difficult doing work/chores - Not difficult at all - - Not difficult at all    phq 9 is negative   Fall Risk: Fall Risk  10/20/2020 04/18/2020 11/15/2019  10/21/2019 08/11/2019  Falls in the past year? 0 0 0 0 0  Number falls in past yr: 0 0 0 0 0  Injury with Fall? 0 0 0 0 0  Follow up - - - Falls prevention discussed -     Functional Status Survey: Is the patient deaf or have difficulty hearing?: No Does the patient have difficulty seeing, even when wearing glasses/contacts?: No Does the patient have difficulty concentrating, remembering, or making decisions?: No Does the patient have difficulty walking or climbing stairs?: No Does the patient have difficulty dressing or bathing?: No Does the patient have difficulty doing  errands alone such as visiting a doctor's office or shopping?: No    Assessment & Plan  1. Screen for colon cancer  refused  2. Need for immunization against influenza  - Flu Vaccine QUAD High Dose(Fluad)  3. Hypothyroidism, adult  - TSH  4. Age-related osteoporosis without current pathological fracture   5. Dyslipidemia  - Lipid panel - atorvastatin (LIPITOR) 40 MG tablet; Take 1 tablet (40 mg total) by mouth daily.  Dispense: 90 tablet; Refill: 1  6. Mild cognitive impairment  - donepezil (ARICEPT) 10 MG tablet; Take 1 tablet (10 mg total) by mouth at bedtime.  Dispense: 90 tablet; Refill: 1  7. Long-term use of high-risk medication  - COMPLETE METABOLIC PANEL WITH GFR - CBC with Differential/Platelet  8. Venous stasis dermatitis of left lower extremity  Seen by podiatrist and given reassurance

## 2020-10-21 LAB — CBC WITH DIFFERENTIAL/PLATELET
Absolute Monocytes: 452 cells/uL (ref 200–950)
Basophils Absolute: 29 cells/uL (ref 0–200)
Basophils Relative: 0.5 %
Eosinophils Absolute: 168 cells/uL (ref 15–500)
Eosinophils Relative: 2.9 %
HCT: 36.9 % (ref 35.0–45.0)
Hemoglobin: 11.9 g/dL (ref 11.7–15.5)
Lymphs Abs: 2076 cells/uL (ref 850–3900)
MCH: 30.4 pg (ref 27.0–33.0)
MCHC: 32.2 g/dL (ref 32.0–36.0)
MCV: 94.1 fL (ref 80.0–100.0)
MPV: 9.6 fL (ref 7.5–12.5)
Monocytes Relative: 7.8 %
Neutro Abs: 3074 cells/uL (ref 1500–7800)
Neutrophils Relative %: 53 %
Platelets: 241 10*3/uL (ref 140–400)
RBC: 3.92 10*6/uL (ref 3.80–5.10)
RDW: 12.4 % (ref 11.0–15.0)
Total Lymphocyte: 35.8 %
WBC: 5.8 10*3/uL (ref 3.8–10.8)

## 2020-10-21 LAB — COMPLETE METABOLIC PANEL WITH GFR
AG Ratio: 1.3 (calc) (ref 1.0–2.5)
ALT: 20 U/L (ref 6–29)
AST: 28 U/L (ref 10–35)
Albumin: 4 g/dL (ref 3.6–5.1)
Alkaline phosphatase (APISO): 55 U/L (ref 37–153)
BUN: 15 mg/dL (ref 7–25)
CO2: 28 mmol/L (ref 20–32)
Calcium: 9.7 mg/dL (ref 8.6–10.4)
Chloride: 107 mmol/L (ref 98–110)
Creat: 0.82 mg/dL (ref 0.60–0.93)
GFR, Est African American: 83 mL/min/{1.73_m2} (ref >=60)
GFR, Est Non African American: 71 mL/min/{1.73_m2} (ref >=60)
Globulin: 3.1 g/dL (calc) (ref 1.9–3.7)
Glucose, Bld: 97 mg/dL (ref 65–99)
Potassium: 4.5 mmol/L (ref 3.5–5.3)
Sodium: 142 mmol/L (ref 135–146)
Total Bilirubin: 0.7 mg/dL (ref 0.2–1.2)
Total Protein: 7.1 g/dL (ref 6.1–8.1)

## 2020-10-21 LAB — LIPID PANEL
Cholesterol: 123 mg/dL (ref <200)
HDL: 54 mg/dL (ref >=50)
LDL Cholesterol (Calc): 49 mg/dL (calc)
Non-HDL Cholesterol (Calc): 69 mg/dL (calc) (ref <130)
Total CHOL/HDL Ratio: 2.3 (calc) (ref <5.0)
Triglycerides: 122 mg/dL (ref <150)

## 2020-10-21 LAB — TSH: TSH: 4.69 mIU/L — ABNORMAL HIGH (ref 0.40–4.50)

## 2020-10-24 ENCOUNTER — Ambulatory Visit (INDEPENDENT_AMBULATORY_CARE_PROVIDER_SITE_OTHER): Payer: Medicare Other

## 2020-10-24 DIAGNOSIS — Z0000 Encounter for general adult medical examination without abnormal findings: Secondary | ICD-10-CM

## 2020-10-24 NOTE — Patient Instructions (Signed)
Lauren Eaton , Thank you for taking time to come for your Medicare Wellness Visit. I appreciate your ongoing commitment to your health goals. Please review the following plan we discussed and let me know if I can assist you in the future.   Screening recommendations/referrals: Colonoscopy: done 08/28/09. Referral sent to [facility] 10/20/20. They will contact you for an appointment Mammogram: done 06/23/20 Bone Density: done 10/25/19 Recommended yearly ophthalmology/optometry visit for glaucoma screening and checkup Recommended yearly dental visit for hygiene and checkup  Vaccinations: Influenza vaccine: done 10/20/20 Pneumococcal vaccine: done 01/27/17 Tdap vaccine: due Shingles vaccine: done 08/21/17 & 10/25/17   Covid-19: done 02/03/20 & 03/02/20  Advanced directives: Advance directive discussed with you today. I have provided a copy for you to complete at home and have notarized. Once this is complete please bring a copy in to our office so we can scan it into your chart.  Conditions/risks identified: Keep up the great work!  Next appointment: Follow up in one year for your annual wellness visit    Preventive Care 65 Years and Older, Female Preventive care refers to lifestyle choices and visits with your health care provider that can promote health and wellness. What does preventive care include?  A yearly physical exam. This is also called an annual well check.  Dental exams once or twice a year.  Routine eye exams. Ask your health care provider how often you should have your eyes checked.  Personal lifestyle choices, including:  Daily care of your teeth and gums.  Regular physical activity.  Eating a healthy diet.  Avoiding tobacco and drug use.  Limiting alcohol use.  Practicing safe sex.  Taking low-dose aspirin every day.  Taking vitamin and mineral supplements as recommended by your health care provider. What happens during an annual well check? The  services and screenings done by your health care provider during your annual well check will depend on your age, overall health, lifestyle risk factors, and family history of disease. Counseling  Your health care provider may ask you questions about your:  Alcohol use.  Tobacco use.  Drug use.  Emotional well-being.  Home and relationship well-being.  Sexual activity.  Eating habits.  History of falls.  Memory and ability to understand (cognition).  Work and work Astronomer.  Reproductive health. Screening  You may have the following tests or measurements:  Height, weight, and BMI.  Blood pressure.  Lipid and cholesterol levels. These may be checked every 5 years, or more frequently if you are over 28 years old.  Skin check.  Lung cancer screening. You may have this screening every year starting at age 75 if you have a 30-pack-year history of smoking and currently smoke or have quit within the past 15 years.  Fecal occult blood test (FOBT) of the stool. You may have this test every year starting at age 18.  Flexible sigmoidoscopy or colonoscopy. You may have a sigmoidoscopy every 5 years or a colonoscopy every 10 years starting at age 60.  Hepatitis C blood test.  Hepatitis B blood test.  Sexually transmitted disease (STD) testing.  Diabetes screening. This is done by checking your blood sugar (glucose) after you have not eaten for a while (fasting). You may have this done every 1-3 years.  Bone density scan. This is done to screen for osteoporosis. You may have this done starting at age 66.  Mammogram. This may be done every 1-2 years. Talk to your health care provider about how often you  should have regular mammograms. Talk with your health care provider about your test results, treatment options, and if necessary, the need for more tests. Vaccines  Your health care provider may recommend certain vaccines, such as:  Influenza vaccine. This is recommended  every year.  Tetanus, diphtheria, and acellular pertussis (Tdap, Td) vaccine. You may need a Td booster every 10 years.  Zoster vaccine. You may need this after age 37.  Pneumococcal 13-valent conjugate (PCV13) vaccine. One dose is recommended after age 86.  Pneumococcal polysaccharide (PPSV23) vaccine. One dose is recommended after age 24. Talk to your health care provider about which screenings and vaccines you need and how often you need them. This information is not intended to replace advice given to you by your health care provider. Make sure you discuss any questions you have with your health care provider. Document Released: 12/29/2015 Document Revised: 08/21/2016 Document Reviewed: 10/03/2015 Elsevier Interactive Patient Education  2017 [place] Prevention in the Home Falls can cause injuries. They can happen to people of all ages. There are many things you can do to make your home safe and to help prevent falls. What can I do on the outside of my home?  Regularly fix the edges of walkways and driveways and fix any cracks.  Remove anything that might make you trip as you walk through a door, such as a raised step or threshold.  Trim any bushes or trees on the path to your home.  Use bright outdoor lighting.  Clear any walking paths of anything that might make someone trip, such as rocks or tools.  Regularly check to see if handrails are loose or broken. Make sure that both sides of any steps have handrails.  Any raised decks and porches should have guardrails on the edges.  Have any leaves, snow, or ice cleared regularly.  Use sand or salt on walking paths during winter.  Clean up any spills in your garage right away. This includes oil or grease spills. What can I do in the bathroom?  Use night lights.  Install grab bars by the toilet and in the tub and shower. Do not use towel bars as grab bars.  Use non-skid mats or decals in the tub or shower.  If  you need to sit down in the shower, use a plastic, non-slip stool.  Keep the floor dry. Clean up any water that spills on the floor as soon as it happens.  Remove soap buildup in the tub or shower regularly.  Attach bath mats securely with double-sided non-slip rug tape.  Do not have throw rugs and other things on the floor that can make you trip. What can I do in the bedroom?  Use night lights.  Make sure that you have a light by your bed that is easy to reach.  Do not use any sheets or blankets that are too big for your bed. They should not hang down onto the floor.  Have a firm chair that has side arms. You can use this for support while you get dressed.  Do not have throw rugs and other things on the floor that can make you trip. What can I do in the kitchen?  Clean up any spills right away.  Avoid walking on wet floors.  Keep items that you use a lot in easy-to-reach places.  If you need to reach something above you, use a strong step stool that has a grab bar.  Keep electrical cords out  of the way.  Do not use floor polish or wax that makes floors slippery. If you must use wax, use non-skid floor wax.  Do not have throw rugs and other things on the floor that can make you trip. What can I do with my stairs?  Do not leave any items on the stairs.  Make sure that there are handrails on both sides of the stairs and use them. Fix handrails that are broken or loose. Make sure that handrails are as long as the stairways.  Check any carpeting to make sure that it is firmly attached to the stairs. Fix any carpet that is loose or worn.  Avoid having throw rugs at the top or bottom of the stairs. If you do have throw rugs, attach them to the floor with carpet tape.  Make sure that you have a light switch at the top of the stairs and the bottom of the stairs. If you do not have them, ask someone to add them for you. What else can I do to help prevent falls?  Wear shoes  that:  Do not have high heels.  Have rubber bottoms.  Are comfortable and fit you well.  Are closed at the toe. Do not wear sandals.  If you use a stepladder:  Make sure that it is fully opened. Do not climb a closed stepladder.  Make sure that both sides of the stepladder are locked into place.  Ask someone to hold it for you, if possible.  Clearly mark and make sure that you can see:  Any grab bars or handrails.  First and last steps.  Where the edge of each step is.  Use tools that help you move around (mobility aids) if they are needed. These include:  Canes.  Walkers.  Scooters.  Crutches.  Turn on the lights when you go into a dark area. Replace any light bulbs as soon as they burn out.  Set up your furniture so you have a clear path. Avoid moving your furniture around.  If any of your floors are uneven, fix them.  If there are any pets around you, be aware of where they are.  Review your medicines with your doctor. Some medicines can make you feel dizzy. This can increase your chance of falling. Ask your doctor what other things that you can do to help prevent falls. This information is not intended to replace advice given to you by your health care provider. Make sure you discuss any questions you have with your health care provider. Document Released: 09/28/2009 Document Revised: 05/09/2016 Document Reviewed: 01/06/2015 Elsevier Interactive Patient Education  2017 [employer].

## 2020-10-24 NOTE — Progress Notes (Signed)
Subjective:   Lauren Eaton is a 74 y.o. female who presents for Medicare Annual (Subsequent) preventive examination.  Virtual Visit via Telephone Note  I connected with  Lauren Eaton on 10/24/20 at 10:00 AM EST by telephone and verified that I am speaking with the correct person using two identifiers.  Medicare Annual Wellness visit completed telephonically due to Covid-19 pandemic.   Location: Patient: home Provider: [facility]   I discussed the limitations, risks, security and privacy concerns of performing an evaluation and management service by telephone and the availability of in person appointments. The patient expressed understanding and agreed to proceed.  Unable to perform video visit due to video visit attempted and failed and/or patient does not have video capability.   Some vital signs may be absent or patient reported.   Reather Littler, LPN    Review of Systems     Cardiac Risk Factors include: advanced age (>76men, >85 women);dyslipidemia     Objective:    There were no vitals filed for this visit. There is no height or weight on file to calculate BMI.  Advanced Directives 10/24/2020 10/21/2019 06/19/2017 04/01/2017 01/27/2017 08/09/2016 02/13/2016  Does Patient Have a Medical Advance Directive? No No No No No No No  Would patient like information on creating a medical advance directive? Yes (MAU/Ambulatory/Procedural Areas - Information given) Yes (MAU/Ambulatory/Procedural Areas - Information given) - No - Patient declined - No - patient declined information No - patient declined information    Current Medications (verified) Outpatient Encounter Medications as of 10/24/2020  Medication Sig  . alendronate (FOSAMAX) 70 MG tablet TAKE 1 TABLET(70 MG) BY MOUTH 1 TIME A WEEK WITH A FULL GLASS OF WATER AND ON AN EMPTY STOMACH  . aspirin EC 81 MG tablet Take 1 tablet (81 mg total) by mouth daily.  Marland Kitchen atorvastatin (LIPITOR) 40 MG tablet Take 1 tablet (40 mg total) by mouth  daily.  . calcium-vitamin D (SM CALCIUM 500/VITAMIN D3) 500-400 MG-UNIT per tablet Take 1 tablet by mouth daily.  Marland Kitchen donepezil (ARICEPT) 10 MG tablet Take 1 tablet (10 mg total) by mouth at bedtime.  Marland Kitchen levothyroxine (SYNTHROID) 25 MCG tablet TAKE 1 TABLET BY MOUTH THREE TIMES A WEEK  . Multiple Vitamin (MULTI-VITAMINS) TABS Take 1 tablet by mouth daily.  . [DISCONTINUED] meloxicam (MOBIC) 15 MG tablet TAKE 1 TABLET(15 MG) BY MOUTH DAILY (Patient not taking: Reported on 10/20/2020)   No facility-administered encounter medications on file as of 10/24/2020.    Allergies (verified) Patient has no known allergies.   History: Past Medical History:  Diagnosis Date  . Abnormal finding on thyroid function test    recheck labs   . Abnormal glucose   . Chronic insomnia   . Dyslipidemia    onset 07/20/10  . History of anemia   . Hyperlipidemia   . Mild cognitive impairment   . Osteoporosis    she took forteo in 2012, but has been off medication, bone density is still showing osteoporosis, refer to rheumatologist  . Vitamin D deficiency    Past Surgical History:  Procedure Laterality Date  . BREAST BIOPSY Right 11/16/2019   rt mass stereo bx path pending x clip  . SURGERY OF LIP     Family History  Problem Relation Age of Onset  . Alzheimer's disease Mother   . Dementia Mother   . Early death Father        MVA  . CVA Sister 25       complications of  .  Cancer Brother        stomach  . Cancer Brother        lung  . Breast cancer Neg Hx    Social History   Socioeconomic History  . Marital status: Single    Spouse name: Not on file  . Number of children: 1  . Years of education: 4  . Highest education level: Not on file  Occupational History  . 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 Smoking status: Never Smoker  . Smokeless tobacco: Former Neurosurgeon    Types: Snuff  . Tobacco comment: Used snuff, dips 3-4 dips/day, has  used it 40-50 years. quit 10/2014.  Vaping Use  . Vaping Use: Never used  Substance and Sexual Activity  . Alcohol use: No    Alcohol/week: 0.0 standard drinks  . Drug use: No  . Sexual activity: Not Currently  Other Topics Concern  . Not on file  Social History Narrative   Lives alone in a senior citizen apartment, has friends and likes to take care of her plants and go out with her friend/neighbors.    Still able to drive - but only grocery stores and church.    Currently watching her great-grandson - infant   Caffeine use: Soda daily   Right handed   Social Determinants of Health   Financial Resource Strain: Low Risk   . Difficulty of Paying Living Expenses: Not hard at all  Food Insecurity: No Food Insecurity  . Worried About Programme researcher, broadcasting/film/video in the Last Year: Never true  . Ran Out of Food in the Last Year: Never true  Transportation Needs: No Transportation Needs  . Lack of Transportation (Medical): No  . Lack of Transportation (Non-Medical): No  Physical Activity: Sufficiently Active  . Days of Exercise per Week: 5 days  . Minutes of Exercise per Session: 50 min  Stress: No Stress Concern Present  . Feeling of Stress : Not at all  Social Connections: Moderately Isolated  . Frequency of Communication with Friends and Family: More than three times a week  . Frequency of Social Gatherings with Friends and Family: More than three times a week  . Attends Religious Services: More than 4 times per year  . Active Member of Clubs or Organizations: No  . Attends Banker Meetings: Never  . Marital Status: Widowed    Tobacco Counseling Counseling given: Not Answered Comment: Used snuff, dips 3-4 dips/day, has used it 40-50 years. quit 10/2014.   Clinical Intake:  Pre-visit preparation completed: Yes  Pain : No/denies pain     Nutritional Risks: None Diabetes: No  How often do you need to have someone help you when you read instructions, pamphlets, or  other written materials from your doctor or pharmacy?: 1 - Never    Interpreter Needed?: No  Information entered by :: Reather Littler LPN   Activities of Daily Living In your present state of health, do you have any difficulty performing the following activities: 10/24/2020 10/20/2020  Hearing? N N  Comment declines hearing aids -  Vision? N N  Difficulty concentrating or making decisions? N N  Walking or climbing stairs? N N  Dressing or bathing? N N  Doing errands, shopping? N N  Preparing Food and eating ? N -  Using the Toilet? N -  In the past six months, have you accidently leaked urine? N -  Do  you have problems with loss of bowel control? N -  Managing your Medications? N -  Managing your Finances? N -  Housekeeping or managing your Housekeeping? N -  Some recent data might be hidden    Patient Care Team: Alba CorySowles, Krichna, MD as PCP - General (Family Medicine)  Indicate any recent Medical Services you may have received from other than [facility] providers in the past year (date may be approximate).     Assessment:   This is a routine wellness examination for Chanci.  Hearing/Vision screen  Hearing Screening   125Hz  250Hz  500Hz  1000Hz  2000Hz  3000Hz  4000Hz  6000Hz  8000Hz   Right ear:           Left ear:           Comments: Pt denies hearing difficulty  Vision Screening Comments: [facility]  Dietary issues and exercise activities discussed: Current Exercise Habits: Home exercise routine, Type of exercise: calisthenics, Time (Minutes): 60, Frequency (Times/Week): 5, Weekly Exercise (Minutes/Week): 300, Intensity: Moderate, Exercise limited by: None identified  Goals    . Patient Stated     Remain healthy and active      Depression Screen PHQ 2/9 Scores 10/24/2020 10/20/2020 04/18/2020 11/15/2019 10/21/2019 08/11/2019 08/11/2017  PHQ - 2 Score 0 0 0 0 0 0 0  PHQ- 9 Score - - 0 0 - 0 -    Fall Risk Fall Risk  10/24/2020 10/20/2020 04/18/2020  11/15/2019 10/21/2019  Falls in the past year? 0 0 0 0 0  Number falls in past yr: 0 0 0 0 0  Injury with Fall? 0 0 0 0 0  Risk for fall due to : No Fall Risks - - - -  Follow up Falls prevention discussed - - - Falls prevention discussed    Any stairs in or around the home? Yes  If so, are there any without handrails? No  Home free of loose throw rugs in walkways, pet beds, electrical cords, etc? Yes  Adequate lighting in your home to reduce risk of falls? Yes   ASSISTIVE DEVICES UTILIZED TO PREVENT FALLS:  Life alert? No  Use of a cane, walker or w/c? No  Grab bars in the bathroom? Yes  Shower chair or bench in shower? Yes  Elevated toilet seat or a handicapped toilet? No   TIMED UP AND GO:  Was the test performed? No . Telephonic visit.   Cognitive Function: MMSE - Mini Mental State Exam 04/27/2018 02/16/2018 08/09/2016 08/09/2016  Orientation to time 3 4 - 5  Orientation to Place 3 5 - 5  Registration 3 3 - 3  Attention/ Calculation 2 5 - 4  Recall 2 3 - 3  Language- name 2 objects 2 2 - 2  Language- repeat 1 1 - 1  Language- follow 3 step command 3 3 - 3  Language- read & follow direction 1 1 - 1  Write a sentence 1 1 - 1  Copy design 1 1 0 (No Data)  Copy design-comments - - - 0  Total score 22 29 - -     6CIT Screen 10/24/2020 10/21/2019  What Year? 0 points 0 points  What month? 0 points 0 points  What time? 0 points 0 points  Count back from 20 0 points 0 points  Months in reverse 4 points 4 points  Repeat phrase 2 points 4 points  Total Score 6 8    Immunizations Immunization History  Administered Date(s) Administered  .  Fluad Quad(high Dose 65+) 08/11/2019, 10/20/2020  . Influenza, High Dose Seasonal PF 08/11/2017, 09/22/2018  . Influenza,inj,Quad PF,6+ Mos 07/31/2015  . Influenza-Unspecified 11/11/2016  . Moderna SARS-COVID-2 Vaccination 02/03/2020, 03/02/2020  . Pneumococcal Conjugate-13 01/27/2017  . Pneumococcal Polysaccharide-23 07/31/2015  . Td  07/26/2009  . Zoster 07/31/2015  . Zoster Recombinat (Shingrix) 08/21/2017, 10/25/2017    TDAP status: Due, Education has been provided regarding the importance of this vaccine. Advised may receive this vaccine at local pharmacy or Health Dept. Aware to provide a copy of the vaccination record if obtained from local pharmacy or Health Dept. Verbalized acceptance and understanding.   Flu Vaccine status: Up to date   Pneumococcal vaccine status: Completed during today's visit.   Covid-19 vaccine status: Completed vaccines  Qualifies for Shingles Vaccine? Yes   Zostavax completed Yes   Shingrix Completed?: Yes  Screening Tests Health Maintenance  Topic Date Due  . MAMMOGRAM  10/24/2020  . COLONOSCOPY  10/20/2021 (Originally 08/17/2019)  . TETANUS/TDAP  10/20/2021 (Originally 07/27/2019)  . INFLUENZA VACCINE  Completed  . DEXA SCAN  Completed  . COVID-19 Vaccine  Completed  . Hepatitis C Screening  Completed  . PNA vac Low Risk Adult  Completed    Health Maintenance  Health Maintenance Due  Topic Date Due  . MAMMOGRAM  10/24/2020    Colorectal cancer screening: Completed 08/28/09. Repeat every 10 years. Referral sent to gastroenterology 10/20/20.   Mammogram status: Completed 06/23/20. Repeat every year   Bone Density status: Completed 10/25/19. Results reflect: Bone density results: OSTEOPOROSIS. Repeat every 2 years.  Lung Cancer Screening: (Low Dose CT Chest recommended if Age 90-80 years, 30 pack-year currently smoking OR have quit w/in 15years.) does not qualify.   Additional Screening:  Hepatitis C Screening: does qualify; Completed 08/11/19  Vision Screening: Recommended annual ophthalmology exams for early detection of glaucoma and other disorders of the eye. Is the patient up to date with their annual eye exam?  No  Who is the provider or what is the name of the office in which the patient attends annual eye exams? [facility]  Dental Screening: Recommended  annual dental exams for proper oral hygiene  Community Resource Referral / Chronic Care Management: CRR required this visit?  No   CCM required this visit?  No      Plan:     I have personally reviewed and noted the following in the patient's chart:   . Medical and social history . Use of alcohol, tobacco or illicit drugs  . Current medications and supplements . Functional ability and status . Nutritional status . Physical activity . Advanced directives . List of other physicians . Hospitalizations, surgeries, and ER visits in previous 12 months . Vitals . Screenings to include cognitive, depression, and falls . Referrals and appointments  In addition, I have reviewed and discussed with patient certain preventive protocols, quality metrics, and best practice recommendations. A written personalized care plan for preventive services as well as general preventive health recommendations were provided to patient.     Reather Littler, LPN   [id number]   Nurse Notes: pt doing well and appreciative of visit

## 2020-11-06 ENCOUNTER — Other Ambulatory Visit: Payer: Self-pay | Admitting: Family Medicine

## 2020-11-06 DIAGNOSIS — G4709 Other insomnia: Secondary | ICD-10-CM

## 2020-11-20 ENCOUNTER — Other Ambulatory Visit: Payer: Self-pay | Admitting: Family Medicine

## 2020-11-20 DIAGNOSIS — E039 Hypothyroidism, unspecified: Secondary | ICD-10-CM

## 2021-02-06 ENCOUNTER — Other Ambulatory Visit: Payer: Self-pay | Admitting: Family Medicine

## 2021-02-06 DIAGNOSIS — E039 Hypothyroidism, unspecified: Secondary | ICD-10-CM

## 2021-03-19 IMAGING — US US BREAST*R* LIMITED INC AXILLA
1 series · 1 of 1 positions shown · non-contrast
Comparison: Previous exam(s).

CLINICAL DATA: Patient recalled from screening for right breast
mass.

EXAM:
DIGITAL DIAGNOSTIC RIGHT MAMMOGRAM WITH CAD AND TOMO
ULTRASOUND RIGHT BREAST

[Series 1: us breast*right* limited inc axilla · 0.07mm/px · 1 of 1 slices shown]
[im 1/1]
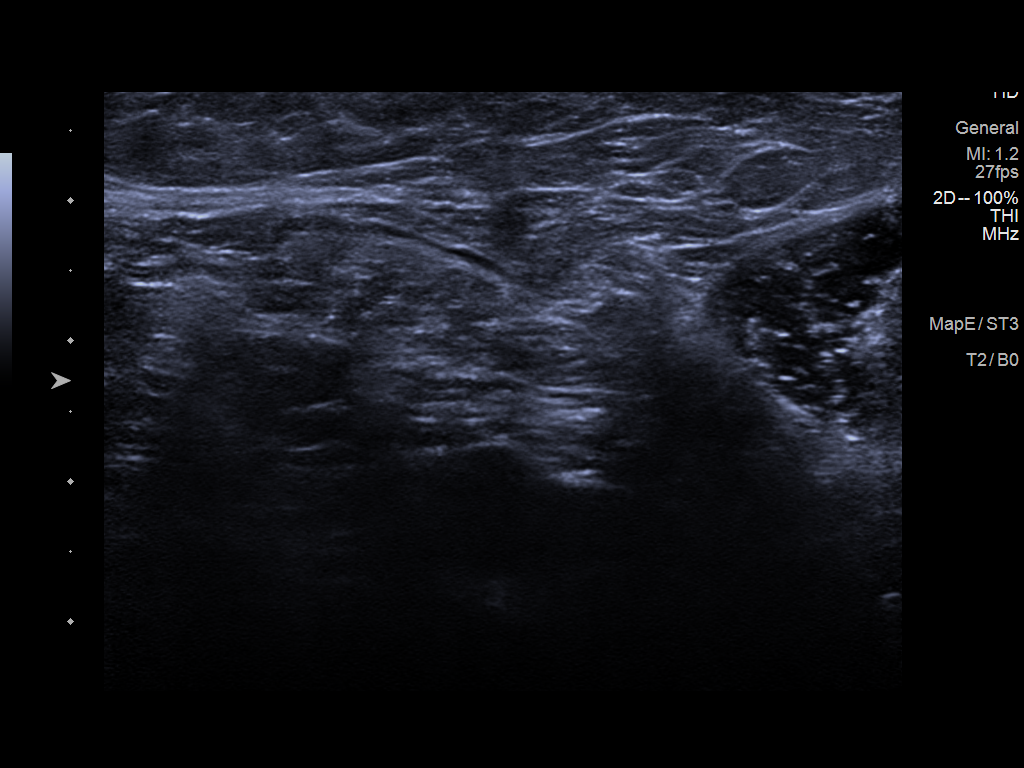

[1 of 1 positions shown; findings below may reference images not displayed]

ACR Breast Density Category c: The breast tissue is heterogeneously
dense, which may obscure small masses.
FINDINGS: Spot compression CC and MLO tomosynthesis images of the right breast
demonstrate a persistent lobular mass within the lower outer right
breast middle depth.

Mammographic images were processed with CAD.

Targeted ultrasound is performed, showing normal right axillary
lymph node. No definite solid or cystic masses identified within the
lower outer right breast posterior depth to correspond with the
mammographic identified mass.
IMPRESSION: Indeterminate mass within the lower outer right breast demonstrated
on mammography.

RECOMMENDATION:
Stereotactic guided core needle biopsy indeterminate mass lower
outer right breast.

I have discussed the findings and recommendations with the patient.
If applicable, a reminder letter will be sent to the patient
regarding the next appointment.

BI-RADS CATEGORY  4: Suspicious.

## 2021-03-19 IMAGING — MG MM DIGITAL DIAGNOSTIC UNILAT*R* W/ TOMO W/ CAD
4 series · 4 of 12 positions shown · non-contrast
Comparison: Previous exam(s).

CLINICAL DATA: Patient recalled from screening for right breast
mass.

EXAM:
DIGITAL DIAGNOSTIC RIGHT MAMMOGRAM WITH CAD AND TOMO
ULTRASOUND RIGHT BREAST

[R MLO synth-2D]
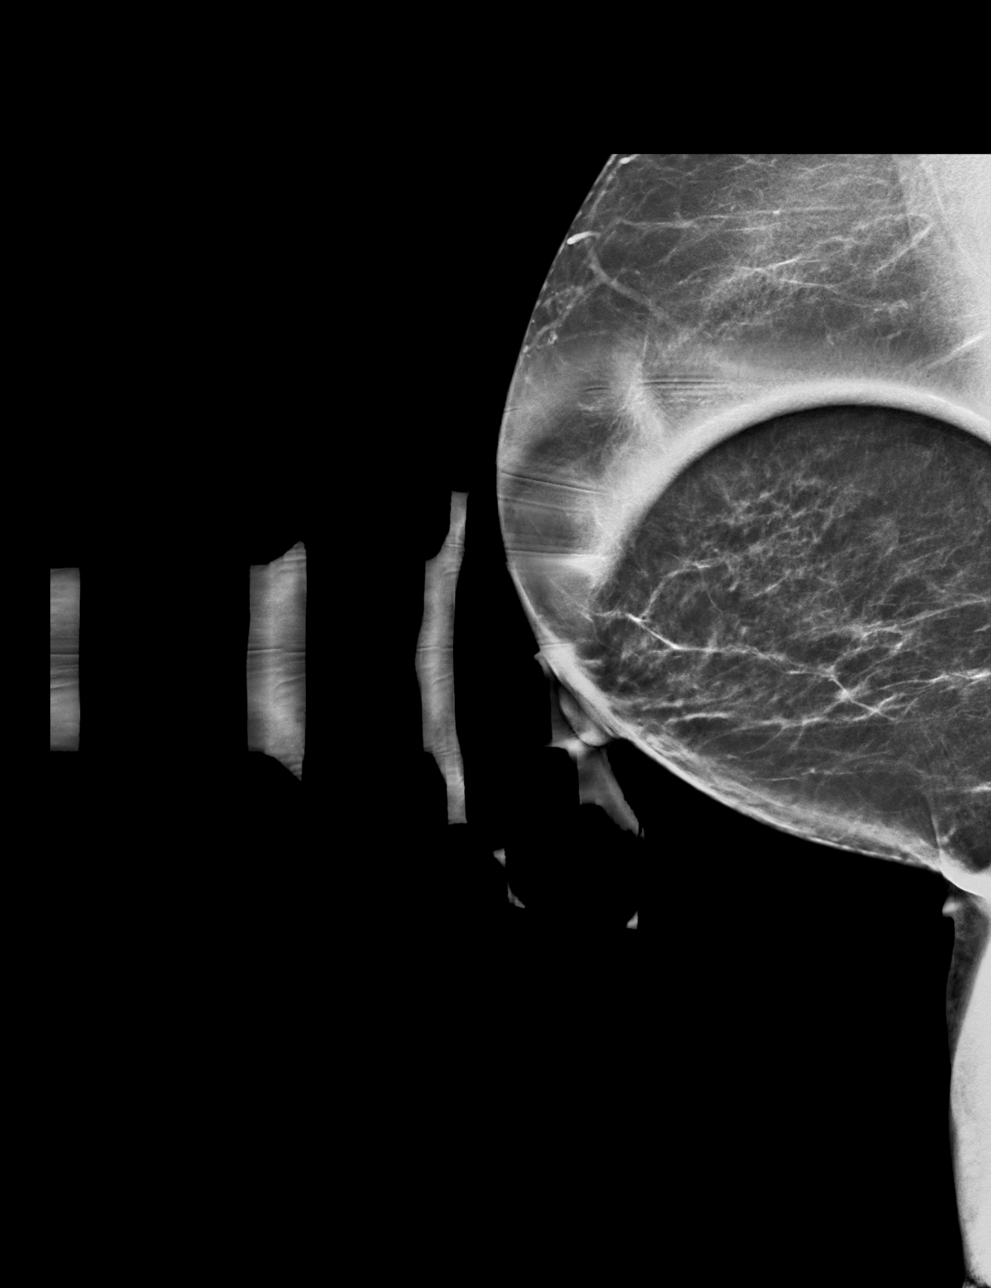

[R CC synth-2D]
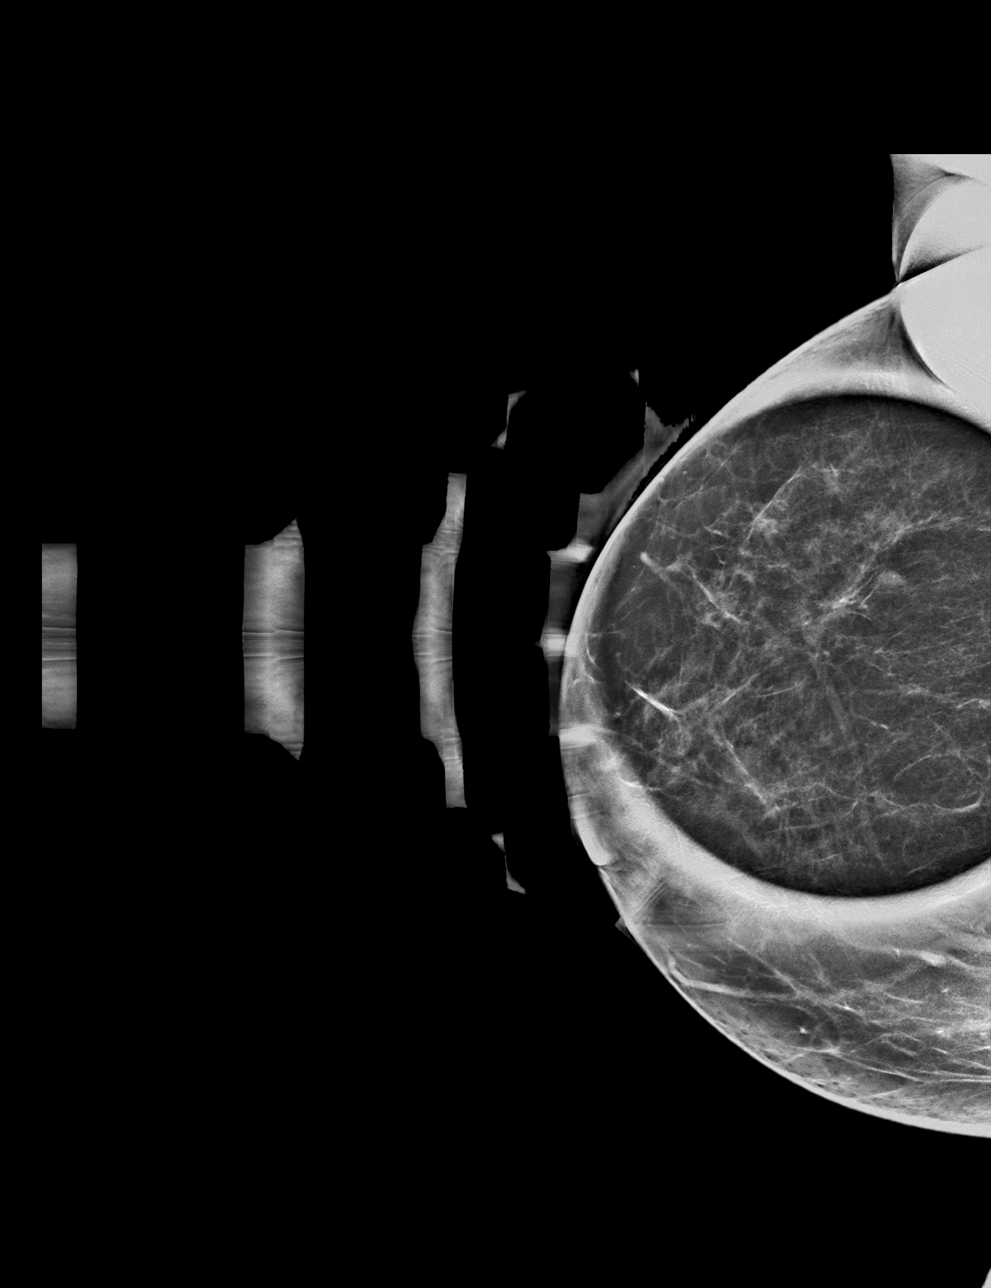

[R CC tomo · tomo slice 28/55.0]
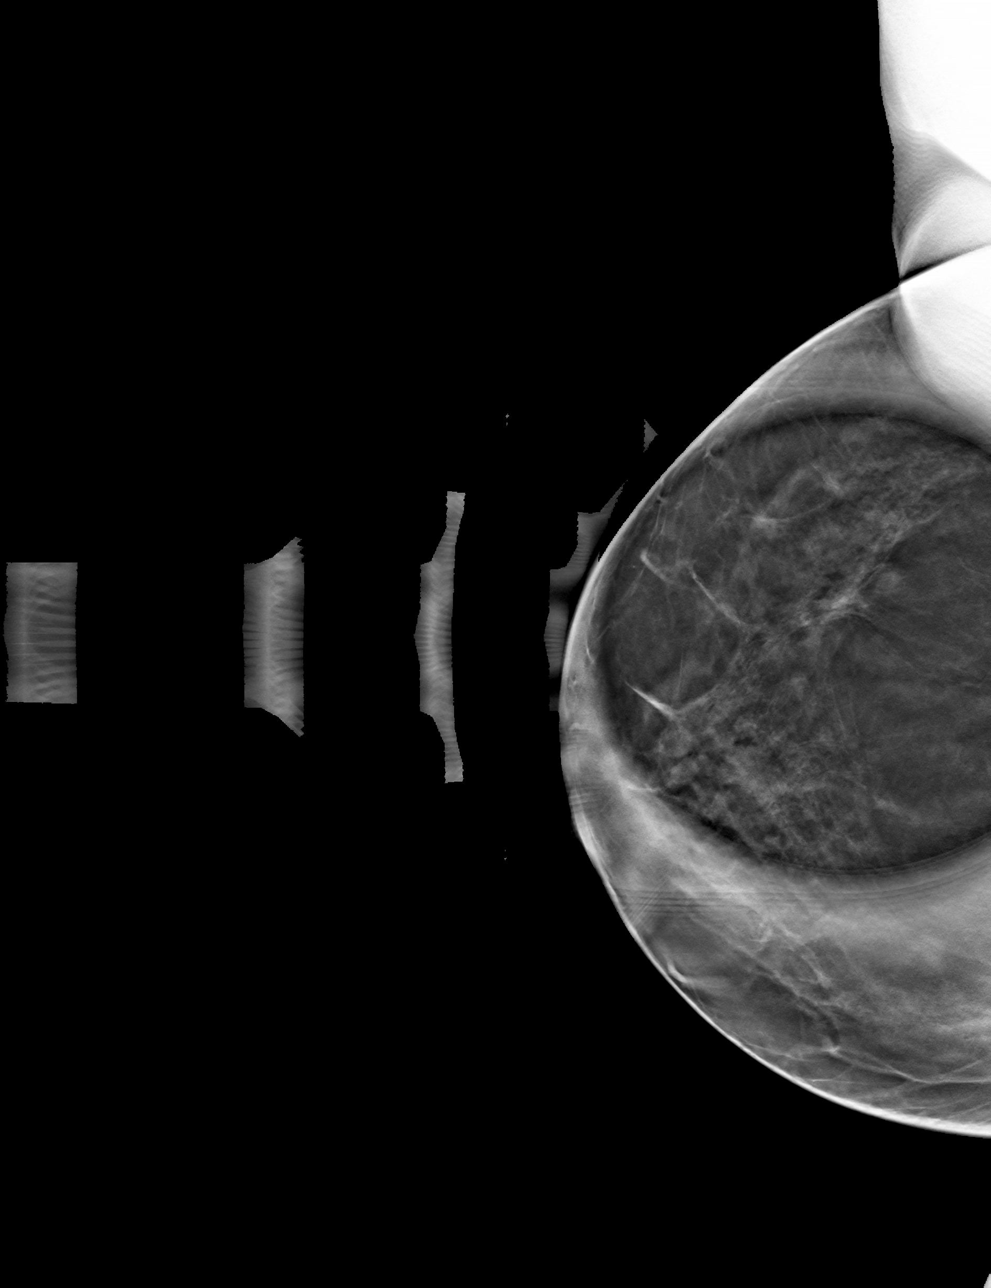

[R MLO tomo · tomo slice 27/52.0]
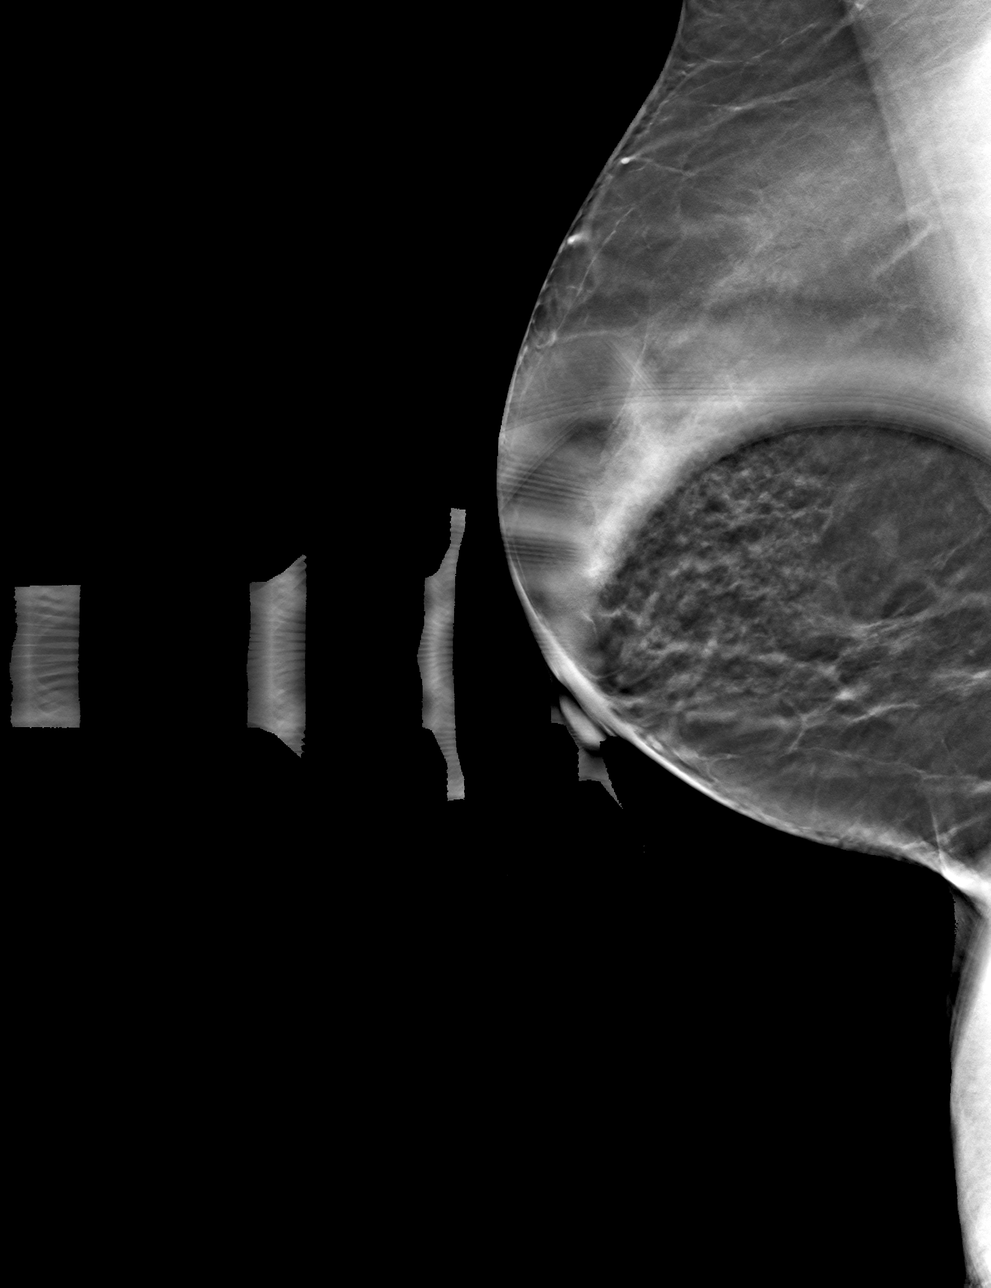

[4 of 12 positions shown; findings below may reference images not displayed]

ACR Breast Density Category c: The breast tissue is heterogeneously
dense, which may obscure small masses.
FINDINGS: Spot compression CC and MLO tomosynthesis images of the right breast
demonstrate a persistent lobular mass within the lower outer right
breast middle depth.

Mammographic images were processed with CAD.

Targeted ultrasound is performed, showing normal right axillary
lymph node. No definite solid or cystic masses identified within the
lower outer right breast posterior depth to correspond with the
mammographic identified mass.
IMPRESSION: Indeterminate mass within the lower outer right breast demonstrated
on mammography.

RECOMMENDATION:
Stereotactic guided core needle biopsy indeterminate mass lower
outer right breast.

I have discussed the findings and recommendations with the patient.
If applicable, a reminder letter will be sent to the patient
regarding the next appointment.

BI-RADS CATEGORY  4: Suspicious.

## 2021-04-16 ENCOUNTER — Other Ambulatory Visit: Payer: Self-pay | Admitting: Family Medicine

## 2021-04-16 DIAGNOSIS — G3184 Mild cognitive impairment, so stated: Secondary | ICD-10-CM

## 2021-04-16 NOTE — Telephone Encounter (Signed)
Pt has an appt on 04/25/21 

## 2021-04-16 NOTE — Telephone Encounter (Signed)
  Notes to clinic:  Patient due for follow up on 04/19/2021 Review for refill    Requested Prescriptions  Pending Prescriptions Disp Refills   donepezil (ARICEPT) 10 MG tablet [Pharmacy Med Name: DONEPEZIL 10MG  TABLETS] 90 tablet 1    Sig: TAKE 1 TABLET(10 MG) BY MOUTH AT BEDTIME      Neurology:  Alzheimer's Agents Passed - 04/16/2021 10:26 AM      Passed - Valid encounter within last 6 months    Recent Outpatient Visits           5 months ago Dyslipidemia   [facility], [place], MD   12 months ago White coat syndrome without diagnosis of hypertension   [facility], MD   1 year ago Acute right ankle pain   [facility], MD   1 year ago Dyslipidemia   [facility], MD   3 years ago Mild cognitive impairment   [facility], MD       Future Appointments             In 6 months [facility]

## 2021-04-25 ENCOUNTER — Encounter: Payer: Self-pay | Admitting: Family Medicine

## 2021-04-25 ENCOUNTER — Other Ambulatory Visit: Payer: Self-pay

## 2021-04-25 ENCOUNTER — Ambulatory Visit (INDEPENDENT_AMBULATORY_CARE_PROVIDER_SITE_OTHER): Payer: Medicare Other | Admitting: Family Medicine

## 2021-04-25 VITALS — BP 136/84 | HR 73 | Temp 98.3°F | Resp 16 | Ht 64.0 in | Wt 130.0 lb

## 2021-04-25 DIAGNOSIS — G3184 Mild cognitive impairment, so stated: Secondary | ICD-10-CM | POA: Diagnosis not present

## 2021-04-25 DIAGNOSIS — M810 Age-related osteoporosis without current pathological fracture: Secondary | ICD-10-CM | POA: Diagnosis not present

## 2021-04-25 DIAGNOSIS — E785 Hyperlipidemia, unspecified: Secondary | ICD-10-CM

## 2021-04-25 DIAGNOSIS — I872 Venous insufficiency (chronic) (peripheral): Secondary | ICD-10-CM

## 2021-04-25 DIAGNOSIS — E039 Hypothyroidism, unspecified: Secondary | ICD-10-CM

## 2021-04-25 DIAGNOSIS — R030 Elevated blood-pressure reading, without diagnosis of hypertension: Secondary | ICD-10-CM | POA: Diagnosis not present

## 2021-04-25 LAB — TSH: TSH: 6.09 mIU/L — ABNORMAL HIGH (ref 0.40–4.50)

## 2021-04-25 MED ORDER — ATORVASTATIN CALCIUM 40 MG PO TABS: 40.0000 mg | ORAL_TABLET | Freq: Every day | ORAL | 1 refills | Status: DC

## 2021-04-25 NOTE — Progress Notes (Signed)
Name: Lauren Eaton   MRN: [id number]    DOB: [birth year]   Date:04/25/2021       Progress Note  Subjective  Chief Complaint  Follow Up  HPI  Osteoporosis: seen by Dr. Gavin Potters n August 2016, and was started on Fosamax, she had a gap, but has been taking it weekly since Feb 2017.She took almost two years of Forteo back in 2012 and 2013. Last bone density was 03/2015 and still showed osteoporosis. She had a repeat bone density done 10/25/2019 and stable. 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 Lives alone.Marland Kitchen MMSinitially was29 at times of complaints, she has been taking Aricept 10 mg and denies side effects . Positive family history of Alzheimer's - mother, also has a sister with symptoms but not formally diagnosed with dementia.She was getting very frustrated with previous neighbor and was calling wicked, she moved out of the apartment complex, she initially still complained that  previous neighbors were bothering her  " they have devices and area sill inside of her house" , daughter states symptoms are stable, still gets forgetful but not at worse than usual, able to pay her bills.   Hypothyroidism: taking levothyroxine every other day, she did not go up to 4 days week like recommended based on her last labs. Weight is stable, no dysphagia, no hair loss. Denies fatigue   Venous statis dermatitis: left inner ankle, stable.   White coat hypertension: bp today is normal, she is relaxes, also normal at home   Patient Active Problem List   Diagnosis Date Noted  . Behavioral change 08/09/2016  . Memory changes  08/09/2016  . 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 Abnormal thyroid function test 07/20/2010  . Dyslipidemia 07/20/2010  . OP (osteoporosis) 07/26/2009  . History of anemia 07/20/2009    Past Surgical History:  Procedure Laterality Date  . BREAST BIOPSY Right 11/16/2019   rt mass stereo bx path pending x clip  . SURGERY OF LIP      Family History  Problem Relation Age of Onset  . Alzheimer's disease Mother   . Dementia Mother   . Early death Father        MVA  . CVA Sister 69       complications of  . Cancer Brother        stomach  . Cancer Brother        lung  . Breast cancer Neg Hx     Social History   Tobacco Use  . Smoking status: Never Smoker  . Smokeless tobacco: Former Neurosurgeon    Types: Snuff  . Tobacco comment: Used snuff, dips 3-4 dips/day, has used it 40-50 years. quit 10/2014.  Substance Use Topics  . Alcohol use: No    Alcohol/week: 0.0 standard drinks     Current Outpatient Medications:  .  alendronate (FOSAMAX) 70 MG tablet, TAKE 1 TABLET(70 MG) BY MOUTH 1 TIME A WEEK WITH A FULL GLASS OF WATER AND ON AN EMPTY STOMACH, Disp: 12 tablet, Rfl: 5 .  aspirin EC 81 MG tablet, Take 1 tablet (81 mg total) by mouth daily., Disp: 30 tablet, Rfl: 0 .  atorvastatin (LIPITOR)  40 MG tablet, Take 1 tablet (40 mg total) by mouth daily., Disp: 90 tablet, Rfl: 1 .  calcium-vitamin D (OSCAL-500) 500-400 MG-UNIT tablet, Take 1 tablet by mouth daily., Disp: , Rfl:  .  donepezil (ARICEPT) 10 MG tablet, TAKE 1 TABLET(10 MG) BY MOUTH AT BEDTIME, Disp: 90 tablet, Rfl: 1 .  levothyroxine (SYNTHROID) 25 MCG tablet, TAKE 1 TABLET BY MOUTH THREE TIMES A WEEK, Disp: 48 tablet, Rfl: 0 .  Multiple Vitamin (MULTI-VITAMINS) TABS, Take 1 tablet by mouth daily., Disp: , Rfl:   No Known Allergies  I personally reviewed active problem list, medication list, allergies, family history, social history, health maintenance with the patient/caregiver today.   ROS  Constitutional: Negative for fever or  weight change.  Respiratory: Negative for cough and shortness of breath.   Cardiovascular: Negative for chest pain or palpitations.  Gastrointestinal: Negative for abdominal pain, no bowel changes.  Musculoskeletal: Negative for gait problem or joint swelling.  Skin: Negative for rash.  Neurological: Negative for dizziness or headache.  No other specific complaints in a complete review of systems (except as listed in HPI above).   Objective  Vitals:   04/25/21 1146  BP: 136/84  Pulse: 73  Resp: 16  Temp: 98.3 F (36.8 C)  TempSrc: Oral  SpO2: 99%  Weight: 130 lb (59 kg)  Height: 5\' 4"  (1.626 m)    Body mass index is 22.31 kg/m.  Physical Exam  Constitutional: Patient appears well-developed and well-nourished.  No distress.  HEENT: head atraumatic, normocephalic, pupils equal and reactive to light,neck supple, no thyromegaly  Cardiovascular: Normal rate, regular rhythm and normal heart sounds.  No murmur heard. No BLE edema. Pulmonary/Chest: Effort normal and breath sounds normal. No respiratory distress. Abdominal: Soft.  There is no tenderness. Psychiatric: Patient has a normal mood and affect. behavior is normal. Judgment and thought content normal.  PHQ2/9: Depression screen [facility] 2/9 04/25/2021 10/24/2020 10/20/2020 04/18/2020 11/15/2019  Decreased Interest 0 0 0 0 0  Down, Depressed, Hopeless 0 0 0 0 0  PHQ - 2 Score 0 0 0 0 0  Altered sleeping - - - 0 0  Tired, decreased energy - - - 0 0  Change in appetite - - - 0 0  Feeling bad or failure about yourself  - - - 0 0  Trouble concentrating - - - 0 0  Moving slowly or fidgety/restless - - - 0 0  Suicidal thoughts - - - 0 0  PHQ-9 Score - - - 0 0  Difficult doing work/chores - - - Not difficult at all -    phq 9 is negative   Fall Risk: Fall Risk  04/25/2021 10/24/2020 10/20/2020 04/18/2020 11/15/2019  Falls in the past year? 0 0 0 0 0  Number falls in past yr: 0 0 0 0 0  Injury with Fall? 0 0 0 0 0  Risk for fall  due to : - No Fall Risks - - -  Follow up - Falls prevention discussed - - -     Functional Status Survey: Is the patient deaf or have difficulty hearing?: Yes Does the patient have difficulty seeing, even when wearing glasses/contacts?: No Does the patient have difficulty concentrating, remembering, or making decisions?: Yes Does the patient have difficulty walking or climbing stairs?: No Does the patient have difficulty dressing or bathing?: No Does the patient have difficulty doing errands alone such as visiting a doctor's office or shopping?: No    Assessment & Plan  1. Hypothyroidism, adult  - TSH  2. Age-related osteoporosis without current pathological fracture   3. Dyslipidemia  - atorvastatin (LIPITOR) 40 MG tablet; Take 1 tablet (40 mg total) by mouth daily.  Dispense: 90 tablet; Refill: 1  4. Venous stasis dermatitis of left lower extremity   5. Mild cognitive impairment   6. White coat syndrome without diagnosis of hypertension  bp at goal today

## 2021-04-26 ENCOUNTER — Other Ambulatory Visit: Payer: Self-pay | Admitting: Family Medicine

## 2021-04-26 DIAGNOSIS — E039 Hypothyroidism, unspecified: Secondary | ICD-10-CM

## 2021-04-26 MED ORDER — LEVOTHYROXINE SODIUM 25 MCG PO TABS: 25.0000 ug | ORAL_TABLET | Freq: Every day | ORAL | 0 refills | Status: DC

## 2021-06-13 ENCOUNTER — Telehealth: Payer: Self-pay

## 2021-06-13 NOTE — Telephone Encounter (Signed)
Pt daughter notified and expressed she will come in to do a TSH recheck per Dr. Carlynn Purl.

## 2021-06-13 NOTE — Telephone Encounter (Signed)
Copied from CRM [id number]. Topic: General - Other >> Jun 12, 2021  5:20 PM Marylen Ponto wrote: Reason for CRM: Pt called for lab results. Pt stated she had labs done a few weeks ago but she never heard from anyone regarding the results. Pt requests call back to go over lab results

## 2021-06-28 DIAGNOSIS — E039 Hypothyroidism, unspecified: Secondary | ICD-10-CM | POA: Diagnosis not present

## 2021-06-28 LAB — TSH: TSH: 4.16 mIU/L (ref 0.40–4.50)

## 2021-06-29 ENCOUNTER — Other Ambulatory Visit: Payer: Self-pay | Admitting: Family Medicine

## 2021-06-29 NOTE — Progress Notes (Signed)
Daughter given results and to continue current dose

## 2021-07-30 ENCOUNTER — Other Ambulatory Visit: Payer: Self-pay | Admitting: Family Medicine

## 2021-07-30 DIAGNOSIS — E039 Hypothyroidism, unspecified: Secondary | ICD-10-CM

## 2021-09-11 ENCOUNTER — Other Ambulatory Visit: Payer: Self-pay

## 2021-09-11 DIAGNOSIS — G3184 Mild cognitive impairment, so stated: Secondary | ICD-10-CM

## 2021-09-11 DIAGNOSIS — E039 Hypothyroidism, unspecified: Secondary | ICD-10-CM

## 2021-09-11 DIAGNOSIS — E785 Hyperlipidemia, unspecified: Secondary | ICD-10-CM

## 2021-09-11 MED ORDER — ATORVASTATIN CALCIUM 40 MG PO TABS: 40.0000 mg | ORAL_TABLET | Freq: Every day | ORAL | 1 refills | Status: DC

## 2021-09-11 MED ORDER — DONEPEZIL HCL 10 MG PO TABS: ORAL_TABLET | ORAL | 1 refills | Status: DC

## 2021-09-11 MED ORDER — LEVOTHYROXINE SODIUM 25 MCG PO TABS: ORAL_TABLET | ORAL | 0 refills | Status: DC

## 2021-09-13 ENCOUNTER — Telehealth: Payer: Self-pay

## 2021-09-13 ENCOUNTER — Other Ambulatory Visit: Payer: Self-pay

## 2021-09-13 NOTE — Telephone Encounter (Signed)
Received RX refill request from [facility], spoke with patient and she stated she does not want to use mail order pharmacy and would like to use [facility] in her chart. Called [facility] and notified them.

## 2021-10-10 ENCOUNTER — Telehealth: Payer: Self-pay | Admitting: *Deleted

## 2021-10-10 NOTE — Chronic Care Management (AMB) (Signed)
  Chronic Care Management   Outreach Note  10/10/2021 Name: Lauren Eaton MRN: [id number] DOB: Sep 25, 1946  Lauren Eaton is a 75 y.o. year old female who is a primary care patient of [name], MD. I reached out to [facility] by phone today in response to a referral sent by Lauren Eaton's primary care provider.  An unsuccessful telephone outreach was attempted today. The patient was referred to the case management team for assistance with care management and care coordination.   Follow Up Plan: A HIPAA compliant phone message was left for the patient providing contact information and requesting a return call.  If patient returns call to provider office, please advise to call Embedded Care Management Care Guide Lauren Eaton at [phone]  Burman Nieves, CCMA Care Guide, Embedded Care Coordination [facility]  Care Management  Direct Dial: [phone]

## 2021-10-25 ENCOUNTER — Ambulatory Visit: Payer: Medicare Other

## 2021-10-29 ENCOUNTER — Ambulatory Visit: Payer: Medicare Other | Admitting: Family Medicine

## 2021-11-02 NOTE — Chronic Care Management (AMB) (Signed)
  Chronic Care Management   Note  11/02/2021 Name: [name] MRN: [id number] DOB: Oct 23, 1946  Lauren Eaton is a 75 y.o. year old female who is a primary care patient of Steele Sizer, MD. I reached out to [facility] by phone today in response to a referral sent by Ms. Kaslyn Dunkerson's PCP.  Ms. Dotter was given information about Chronic Care Management services today including:  CCM service includes personalized support from designated clinical staff supervised by her physician, including individualized plan of care and coordination with other care providers 24/7 contact phone numbers for assistance for urgent and routine care needs. Service will only be billed when office clinical staff spend 20 minutes or more in a month to coordinate care. Only one practitioner may furnish and bill the service in a calendar month. The patient may stop CCM services at any time (effective at the end of the month) by phone call to the office staff. The patient is responsible for co-pay (up to 20% after annual deductible is met) if co-pay is required by the individual health plan.   Patient agreed to services and verbal consent obtained.   Follow up plan: Telephone appointment with care management team member scheduled for: 11/21/2021  Julian Hy, [facility]  Direct Dial: [phone]

## 2021-11-21 ENCOUNTER — Telehealth: Payer: Self-pay

## 2021-11-21 ENCOUNTER — Ambulatory Visit: Payer: Medicare Other

## 2021-11-21 DIAGNOSIS — E785 Hyperlipidemia, unspecified: Secondary | ICD-10-CM

## 2021-11-21 NOTE — Patient Instructions (Addendum)
Visit Information   Thank you for allowing the Chronic Care Management team to participate in your care. It was great speaking with you today. Please don't hesitate to contact me if I can be of assistance to you before our next scheduled appointment.  Our next appointment is by telephone on December 19, 2021 at 0945.  Please call the care guide team at [phone] if you need to cancel or reschedule your appointment.   Following is a copy of your full care plan:  Patient Care Plan: RN Care Management Plan of Care     Problem Identified: Dyslipidemia, Osteoporosis      Long-Range Goal: Disease Progression Prevented or Minimized   Start Date: 11/21/2021  Expected End Date: 02/19/2022  Priority: High  Note:   Current Barriers:  Chronic Disease Management support and education needs related to HLD and Osteoporosis   RNCM Clinical Goal(s):  Patient will demonstrate ongoing adherence to prescribed treatment plan for HLD and Osteoporosis through collaboration with  the provider RN Care Manager and care management team.  Interventions: 1:1 collaboration with primary care provider regarding development and update of comprehensive plan of care as evidenced by provider attestation and co-signature Inter-disciplinary care team collaboration (see longitudinal plan of care) Evaluation of current treatment plan related to  self management and patient's adherence to plan as established by provider   Hyperlipidemia Interventions:  (Status:  New goal.) Long Term Goal Lab Results  Component Value Date   CHOL 123 10/20/2020   HDL 54 10/20/2020   LDLCALC 49 10/20/2020   TRIG 122 10/20/2020   CHOLHDL 2.3 10/20/2020    Medication review performed. Patient advised to continue taking medications as prescribed and notify provider if unable to tolerate the prescribed regimen. Provider established cholesterol goals reviewed Counseled on importance of regular laboratory monitoring as prescribed Discussed  strategies to manage statin-induced myalgias Reviewed importance of limiting foods high in cholesterol Reviewed exercise goals and target of 150 minutes per week Reviewed s/sx of heart attack, stroke and indications for seeking immediate medical attention.  Patient Goals/Self-Care Activities: Take all medications as prescribed Attend all scheduled provider appointments Call pharmacy for medication refills 3-7 days in advance of running out of medications Perform all self care activities independently  Perform IADL's (shopping, preparing meals, housekeeping, managing finances) independently Call provider office for new concerns or questions    Follow Up Plan:   Will follow up next month         Consent to CCM Services: Ms. Pharris was given information about Chronic Care Management services including:  CCM service includes personalized support from designated clinical staff supervised by her physician, including individualized plan of care and coordination with other care providers 24/7 contact phone numbers for assistance for urgent and routine care needs. Service will only be billed when office clinical staff spend 20 minutes or more in a month to coordinate care. Only one practitioner may furnish and bill the service in a calendar month. The patient may stop CCM services at any time (effective at the end of the month) by phone call to the office staff. The patient will be responsible for cost sharing (co-pay) of up to 20% of the service fee (after annual deductible is met).  Patient agreed to services and verbal consent obtained.   Ms. Lohnes verbalized understanding of the information discussed during the telephonic outreach. Declined need for mailed/printed instructions.  A member of the care management team will follow up next month.   Jameika Kinn,RN [facility] Care Management [facility] [phone]

## 2021-11-21 NOTE — Chronic Care Management (AMB) (Signed)
Chronic Care Management   CCM RN Visit Note  11/21/2021 Name: Lauren Eaton MRN: [id number] DOB: Feb 05, 1946  Subjective: Lauren Eaton is a 75 y.o. year old female who is a primary care patient of Steele Sizer, MD. The care management team was consulted for assistance with disease management and care coordination needs.    Engaged with patient by telephone for initial visit in response to provider referral for case management and care coordination services.   Consent to Services:  The patient was given the following information about Chronic Care Management services: 1. CCM service includes personalized support from designated clinical staff supervised by the primary care provider, including individualized plan of care and coordination with other care providers 2. 24/7 contact phone numbers for assistance for urgent and routine care needs. 3. Service will only be billed when office clinical staff spend 20 minutes or more in a month to coordinate care. 4. Only one practitioner may furnish and bill the service in a calendar month. 5.The patient may stop CCM services at any time (effective at the end of the month) by phone call to the office staff. 6. The patient will be responsible for cost sharing (co-pay) of up to 20% of the service fee (after annual deductible is met). Patient agreed to services and consent obtained.  Assessment: Review of patient past medical history, allergies, medications, health status, including review of consultants reports, laboratory and other test data, was performed as part of comprehensive evaluation and provision of chronic care management services.   SDOH (Social Determinants of Health) assessments and interventions performed:  SDOH Interventions    Flowsheet Row Most Recent Value  SDOH Interventions   Food Insecurity Interventions Intervention Not Indicated  Transportation Interventions Intervention Not Indicated        CCM Care Plan  No Known  Allergies  Outpatient Encounter Medications as of 11/21/2021  Medication Sig   aspirin EC 81 MG tablet Take 1 tablet (81 mg total) by mouth daily.   atorvastatin (LIPITOR) 40 MG tablet Take 1 tablet (40 mg total) by mouth daily.   calcium-vitamin D (OSCAL-500) 500-400 MG-UNIT tablet Take 1 tablet by mouth daily.   donepezil (ARICEPT) 10 MG tablet TAKE 1 TABLET(10 MG) BY MOUTH AT BEDTIME   levothyroxine (SYNTHROID) 25 MCG tablet TAKE 1 TABLET(25 MCG) BY MOUTH DAILY BEFORE AND BREAKFAST   Multiple Vitamin (MULTI-VITAMINS) TABS Take 1 tablet by mouth daily.   alendronate (FOSAMAX) 70 MG tablet TAKE 1 TABLET(70 MG) BY MOUTH 1 TIME A WEEK WITH A FULL GLASS OF WATER AND ON AN EMPTY STOMACH   No facility-administered encounter medications on file as of 11/21/2021.    Patient Active Problem List   Diagnosis Date Noted   Behavioral change 08/09/2016   Memory changes 08/09/2016   Coccyalgia 07/29/2015   Abnormal thyroid function test 07/20/2010   Dyslipidemia 07/20/2010   OP (osteoporosis) 07/26/2009   History of anemia 07/20/2009    Conditions to be addressed/monitored:HLD and Osteoporosis  Patient Care Plan: RN Care Management Plan of Care     Problem Identified: Dyslipidemia, Osteoporosis      Long-Range Goal: Disease Progression Prevented or Minimized   Start Date: 11/21/2021  Expected End Date: 02/19/2022  Priority: High  Note:   Current Barriers:  Chronic Disease Management support and education needs related to HLD and Osteoporosis   RNCM Clinical Goal(s):  Patient will demonstrate ongoing adherence to prescribed treatment plan for HLD and Osteoporosis through collaboration with  the provider RN Care Manager and  care management team.  Interventions: 1:1 collaboration with primary care provider regarding development and update of comprehensive plan of care as evidenced by provider attestation and co-signature Inter-disciplinary care team collaboration (see longitudinal plan of  care) Evaluation of current treatment plan related to  self management and patient's adherence to plan as established by provider   Hyperlipidemia Interventions:  (Status:  New goal.) Long Term Goal Lab Results  Component Value Date   CHOL 123 10/20/2020   HDL 54 10/20/2020   LDLCALC 49 10/20/2020   TRIG 122 10/20/2020   CHOLHDL 2.3 10/20/2020    Medication review performed. Patient advised to continue taking medications as prescribed and notify provider if unable to tolerate the prescribed regimen. Provider established cholesterol goals reviewed Counseled on importance of regular laboratory monitoring as prescribed Discussed strategies to manage statin-induced myalgias Reviewed importance of limiting foods high in cholesterol Reviewed exercise goals and target of 150 minutes per week Reviewed s/sx of heart attack, stroke and indications for seeking immediate medical attention.  Patient Goals/Self-Care Activities: Take all medications as prescribed Attend all scheduled provider appointments Call pharmacy for medication refills 3-7 days in advance of running out of medications Perform all self care activities independently  Perform IADL's (shopping, preparing meals, housekeeping, managing finances) independently Call provider office for new concerns or questions    Follow Up Plan:   Will follow up next month        PLAN A member of the care management team will follow up next month.   [facility] Care Management [facility] [phone]

## 2021-11-21 NOTE — Telephone Encounter (Signed)
Error. Please disregard

## 2021-11-26 ENCOUNTER — Ambulatory Visit: Payer: Self-pay | Admitting: *Deleted

## 2021-11-26 NOTE — Telephone Encounter (Signed)
  Chief Complaint: Rash Symptoms: Itchy rash on back "Feels like when I has shingles." Frequency: Onset 1 week ago Pertinent Negatives: Patient denies no rash elsewhere, no pain at site, no fever Disposition: [] ED /[] Urgent Care (no appt availability in office) / [x] Appointment(In office/virtual)/ []  [facility]/ [] Home Care/ [] Refused Recommended Disposition  Additional Notes: Pt reports rash "Middle finger long" at back, side "Like when I had shingles." Red, not raised,dry, severe itching. Appt for tomorrow with Dr. per .

## 2021-11-26 NOTE — Telephone Encounter (Signed)
Pt's daughter called to report that the patient feels like her shingles may be coming back, wants to be seen sooner. Reports itching on her back like before   Best contact: [phone]   Wants to be seen sooner than upcoming appt if possible       Reason for Disposition . Localized rash present > 7 days  Answer Assessment - Initial Assessment Questions 1. APPEARANCE of RASH: "Describe the rash."      Red little bumps 2. LOCATION: "Where is the rash located?"      More on middle 3. NUMBER: "How many spots are there?"      Not sure 4. SIZE: "How big are the spots?" (Inches, centimeters or compare to size of a coin)      Area long as middle finger 5. ONSET: "When did the rash start?"      1 week ago 6. ITCHING: "Does the rash itch?" If Yes, ask: "How bad is the itch?"  (Scale 0-10; or none, mild, moderate, severe)     Itching, no pain 7. PAIN: "Does the rash hurt?" If Yes, ask: "How bad is the pain?"  (Scale 0-10; or none, mild, moderate, severe)    - NONE (0): no pain    - MILD (1-3): doesn't interfere with normal activities     - MODERATE (4-7): interferes with normal activities or awakens from sleep     - SEVERE (8-10): excruciating pain, unable to do any normal activities     no 8. OTHER SYMPTOMS: "Do you have any other symptoms?" (e.g., fever)     no  Protocols used: Rash or Redness - Localized-A-AH

## 2021-11-27 ENCOUNTER — Ambulatory Visit (INDEPENDENT_AMBULATORY_CARE_PROVIDER_SITE_OTHER): Payer: Medicare Other | Admitting: Internal Medicine

## 2021-11-27 ENCOUNTER — Encounter: Payer: Self-pay | Admitting: Internal Medicine

## 2021-11-27 ENCOUNTER — Other Ambulatory Visit: Payer: Self-pay

## 2021-11-27 VITALS — BP 162/82 | HR 85 | Temp 98.3°F | Resp 16 | Ht 64.0 in | Wt 129.4 lb

## 2021-11-27 DIAGNOSIS — L509 Urticaria, unspecified: Secondary | ICD-10-CM | POA: Diagnosis not present

## 2021-11-27 DIAGNOSIS — Z23 Encounter for immunization: Secondary | ICD-10-CM | POA: Diagnosis not present

## 2021-11-27 MED ORDER — HYDROCORTISONE 1 % EX OINT: 1.0000 "application " | TOPICAL_OINTMENT | Freq: Two times a day (BID) | CUTANEOUS | 0 refills | Status: DC

## 2021-11-27 MED ORDER — LORATADINE 10 MG PO TABS: 10.0000 mg | ORAL_TABLET | Freq: Every day | ORAL | 11 refills | Status: AC

## 2021-11-27 NOTE — Progress Notes (Signed)
Acute Office Visit  Subjective:    Patient ID: Lauren Eaton, female    DOB: [birth year], 75 y.o.   MRN: [id number]  Chief Complaint  Patient presents with   Rash    On back    HPI Patient is in today for rash. She does have a history of Shingles in 2013-2014 and did complete the vaccination series. This current rash started 1 week ago on back, very itchy. Feels like spreading around the sides but hard to see.   RASH Duration:  1 week Location:  back   Itching: yes Burning: no Redness: no Oozing: no Scaling: no Blisters: no Painful: no Fevers: no Change in detergents/soaps/personal care products: no Recent illness: no Recent travel:no History of same: no, hx of shingles on abdomen  Context:  same Alleviating factors: benadryl Treatments attempted:benadryl Shortness of breath: no  Throat/tongue swelling: no Myalgias/arthralgias: no   Past Medical History:  Diagnosis Date   Abnormal finding on thyroid function test    recheck labs    Abnormal glucose    Chronic insomnia    Dyslipidemia    onset 07/20/10   History of anemia    Hyperlipidemia    Mild cognitive impairment    Osteoporosis    she took forteo in 2012, but has been off medication, bone density is still showing osteoporosis, refer to rheumatologist   Vitamin D deficiency     Past Surgical History:  Procedure Laterality Date   BREAST BIOPSY Right 11/16/2019   rt mass stereo bx path pending x clip   SURGERY OF LIP      Family History  Problem Relation Age of Onset   Alzheimer's disease Mother    Dementia Mother    Early death Father        MVA   CVA Sister 59       complications of   Cancer Brother        stomach   Cancer Brother        lung   Breast cancer Neg Hx     Social History   Socioeconomic History   Marital status: Single    Spouse name: Not on file   Number of children: 1   Years of education: 4   Highest education level: Not on file  Occupational History    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 quit 10/2014.  Vaping Use   Vaping Use: Never used  Substance and Sexual Activity   Alcohol use: No    Alcohol/week: 0.0 standard drinks   Drug use: No   Sexual activity: Not Currently  Other Topics Concern   Not on file  Social History Narrative   Lives alone in a senior citizen apartment, has friends and likes to take care of her plants and go out with her friend/neighbors.    Still able to drive - but only grocery stores and church.    Currently watching her great-grandson - infant   Caffeine use: Soda daily   Right handed   Social Determinants of Health   Financial Resource Strain: Not on file  Food Insecurity: No Food Insecurity   Worried About Programme researcher, broadcasting/film/video in the Last Year: Never true   [employer]  in the Last Year: Never true  Transportation Needs: No Transportation Needs   Lack of Transportation (Medical): No   Lack of Transportation (Non-Medical): No  Physical Activity: Not on file  Stress: Not on file  Social Connections: Not on file  Intimate Partner Violence: Not on file    Outpatient Medications Prior to Visit  Medication Sig Dispense Refill   alendronate (FOSAMAX) 70 MG tablet TAKE 1 TABLET(70 MG) BY MOUTH 1 TIME A WEEK WITH A FULL GLASS OF WATER AND ON AN EMPTY STOMACH 12 tablet 5   aspirin EC 81 MG tablet Take 1 tablet (81 mg total) by mouth daily. 30 tablet 0   atorvastatin (LIPITOR) 40 MG tablet Take 1 tablet (40 mg total) by mouth daily. 90 tablet 1   calcium-vitamin D (OSCAL-500) 500-400 MG-UNIT tablet Take 1 tablet by mouth daily.     donepezil (ARICEPT) 10 MG tablet TAKE 1 TABLET(10 MG) BY MOUTH AT BEDTIME 90 tablet 1   levothyroxine (SYNTHROID) 25 MCG tablet TAKE 1 TABLET(25 MCG) BY MOUTH DAILY  BEFORE AND BREAKFAST 90 tablet 0   Multiple Vitamin (MULTI-VITAMINS) TABS Take 1 tablet by mouth daily.     No facility-administered medications prior to visit.    No Known Allergies  Review of Systems  Constitutional:  Negative for chills and fever.  Respiratory:  Negative for cough, shortness of breath and wheezing.   Cardiovascular:  Negative for chest pain.  Gastrointestinal:  Negative for abdominal pain.  Skin:  Positive for rash.  Neurological:  Negative for numbness.      Objective:    Physical Exam Constitutional:      Appearance: Normal appearance.  HENT:     Head: Normocephalic and atraumatic.  Eyes:     Conjunctiva/sclera: Conjunctivae normal.  Cardiovascular:     Rate and Rhythm: Normal rate and regular rhythm.  Pulmonary:     Effort: Pulmonary effort is normal.     Breath sounds: Normal breath sounds.  Musculoskeletal:     Right lower leg: No edema.     Left lower leg: No edema.  Skin:    General: Skin is warm and dry.     Comments: Uticarial appearing rash without erythema on left side of back at the level of T10. No blisters present, no signs of infection.   Neurological:     General: No focal deficit present.     Mental Status: She is alert. Mental status is at baseline.  Psychiatric:        Mood and Affect: Mood normal.        Behavior: Behavior normal.    BP (!) 162/82   Pulse 85   Temp 98.3 F (36.8 C)   Resp 16   Ht 5\' 4"  (1.626 m)   Wt 129 lb 6.4 oz (58.7 kg)   SpO2 99%   BMI 22.21 kg/m  Wt Readings from Last 3 Encounters:  04/25/21 130 lb (59 kg)  10/20/20 128 lb 9.6 oz (58.3 kg)  04/18/20 129 lb 4.8 oz (58.7 kg)    Health Maintenance Due  Topic Date Due   TETANUS/TDAP  07/27/2019   COLONOSCOPY (Pts 45-85yrs Insurance coverage will need to be confirmed)  08/17/2019   MAMMOGRAM  10/24/2020   COVID-19 Vaccine (4 - Booster for Moderna series) 02/26/2021   INFLUENZA VACCINE  07/16/2021    There are no preventive care reminders to  display for this patient.   Lab Results  Component Value Date   TSH 4.16 06/28/2021  Lab Results  Component Value Date   WBC 5.8 10/20/2020   HGB 11.9 10/20/2020   HCT 36.9 10/20/2020   MCV 94.1 10/20/2020   PLT 241 10/20/2020   Lab Results  Component Value Date   NA 142 10/20/2020   K 4.5 10/20/2020   CO2 28 10/20/2020   GLUCOSE 97 10/20/2020   BUN 15 10/20/2020   CREATININE 0.82 10/20/2020   BILITOT 0.7 10/20/2020   ALKPHOS 56 08/11/2017   AST 28 10/20/2020   ALT 20 10/20/2020   PROT 7.1 10/20/2020   ALBUMIN 4.2 08/11/2017   CALCIUM 9.7 10/20/2020   ANIONGAP 5 (L) 04/22/2014   Lab Results  Component Value Date   CHOL 123 10/20/2020   Lab Results  Component Value Date   HDL 54 10/20/2020   Lab Results  Component Value Date   LDLCALC 49 10/20/2020   Lab Results  Component Value Date   TRIG 122 10/20/2020   Lab Results  Component Value Date   CHOLHDL 2.3 10/20/2020   No results found for: HGBA1C     Assessment & Plan:   1. Need for influenza vaccination: Flu vaccine administered today.    - Flu Vaccine QUAD High Dose(Fluad)  2. Hives: Rash appears urticarial, no other allergy symptoms or exposures that she knows of. Treat with over the counter Benadryl cream, steroid cream and oral anti-histamine. Follow up if symptoms worsen or fail to resolve.   - loratadine (CLARITIN) 10 MG tablet; Take 1 tablet (10 mg total) by mouth daily.  Dispense: 30 tablet; Refill: 11 - hydrocortisone 1 % ointment; Apply 1 application topically 2 (two) times daily.  Dispense: 30 g; Refill: 0   Margarita Mail, DO

## 2021-11-27 NOTE — Patient Instructions (Addendum)
It was great seeing you today!  Plan discussed at today's visit: -Use Benadryl cream, steroid cream and allergy medication for rash, once resolved you can stop.  -If rash becomes more painful or spreads, let me know.  Follow up in: as needed.   Take care and let us know if you have any questions or concerns prior to your next visit.  Dr. Caralee Ates

## 2021-11-28 ENCOUNTER — Other Ambulatory Visit: Payer: Self-pay | Admitting: Family Medicine

## 2021-11-28 DIAGNOSIS — M810 Age-related osteoporosis without current pathological fracture: Secondary | ICD-10-CM

## 2021-12-05 NOTE — Progress Notes (Signed)
Name: Lauren Eaton   MRN: [id number]    DOB: 01/13/1946   Date:12/07/2021       Progress Note  Subjective  Chief Complaint  Follow Up  HPI  Osteoporosis: seen by Dr. Gavin Potters n August 2016, and was started on Fosamax, she had a gap, but has been taking it weekly since Feb 2017. She took almost two years of Forteo back in 2012 and 2013. Last bone density was 03/2015 and still showed osteoporosis. She had a repeat bone density done 10/25/2019 and stable. She resumed Fosamax Fall 2020 , tolerating medication well, taking vitamin D supplementation last vitamin D level was at goal  We will recheck bone density    Hyperlipidemia: on statin therapy, last LDL 49, HDL at goal at 54, she denies side effects of medication. Still goes out to eat frequently   Post-herpetic neuralgia: she had shingles in 2013, and was doing well, however over the past month she noticed significant itching at the area of previous episode. 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 Lives alone. Marland Kitchen MMS initially was  29 she has been taking Aricept 10 mg and denies side effects  . Positive family history of Alzheimer's - mother, also has a sister with symptoms but not formally diagnosed with dementia. She was getting very frustrated with previous neighbor and was calling wicked, she moved out of the apartment complex, she initially still complained that  previous neighbors were bothering her  " they have devices and area sill inside of her house" , daughter states symptoms are stable, still gets forgetful but not at worse than usual, able to pay her bills. She goes out to eat with her friend.   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    Patient Active Problem List   Diagnosis Date Noted   Behavioral change 08/09/2016   Memory changes 08/09/2016   Coccyalgia 07/29/2015   Abnormal thyroid function test 07/20/2010   Dyslipidemia 07/20/2010   OP (osteoporosis) 07/26/2009   History of anemia 07/20/2009    Past Surgical History:  Procedure Laterality Date   BREAST BIOPSY Right 11/16/2019   rt mass stereo bx path pending x clip   SURGERY OF LIP      Family History  Problem Relation Age of Onset   Alzheimer's disease Mother    Dementia Mother    Early death Father        MVA   CVA Sister 21       complications of   Cancer Brother        stomach   Cancer Brother        lung   Breast cancer Neg Hx     Social History   Tobacco Use   Smoking status: Never   Smokeless tobacco: Former    Types: Snuff   Tobacco comments:    Used snuff, dips 3-4 dips/day, has used it 40-50 years. quit 10/2014.  Substance Use Topics   Alcohol use: No    Alcohol/week: 0.0 standard drinks     Current Outpatient Medications:    alendronate (FOSAMAX) 70 MG tablet, TAKE 1  TABLET(70 MG) BY MOUTH 1 TIME A WEEK WITH A FULL GLASS OF WATER AND ON AN EMPTY STOMACH, Disp: 12 tablet, Rfl: 5   aspirin EC 81 MG tablet, Take 1 tablet (81 mg total) by mouth daily., Disp: 30 tablet, Rfl: 0   atorvastatin (LIPITOR) 40 MG tablet, Take 1 tablet (40 mg total) by mouth daily., Disp: 90 tablet, Rfl: 1   calcium-vitamin D (OSCAL-500) 500-400 MG-UNIT tablet, Take 1 tablet by mouth daily., Disp: , Rfl:    donepezil (ARICEPT) 10 MG tablet, TAKE 1 TABLET(10 MG) BY MOUTH AT BEDTIME, Disp: 90 tablet, Rfl: 1   hydrocortisone 1 % ointment, Apply 1 application topically 2 (two) times daily., Disp: 30 g, Rfl: 0   levothyroxine (SYNTHROID) 25 MCG tablet, TAKE 1 TABLET(25 MCG) BY MOUTH DAILY BEFORE AND BREAKFAST, Disp: 90 tablet, Rfl: 0   loratadine (CLARITIN) 10 MG  tablet, Take 1 tablet (10 mg total) by mouth daily., Disp: 30 tablet, Rfl: 11   Multiple Vitamin (MULTI-VITAMINS) TABS, Take 1 tablet by mouth daily., Disp: , Rfl:   No Known Allergies  I personally reviewed active problem list, medication list, allergies, family history, social history, health maintenance with the patient/caregiver today.   ROS  Constitutional: Negative for fever or weight change.  Respiratory: Negative for cough and shortness of breath.   Cardiovascular: Negative for chest pain or palpitations.  Gastrointestinal: Negative for abdominal pain, no bowel changes.  Musculoskeletal: Negative for gait problem or joint swelling.  Skin: Negative for rash.  Neurological: Negative for dizziness or headache.  No other specific complaints in a complete review of systems (except as listed in HPI above).   Objective  Vitals:   12/07/21 1002  BP: 128/70  Pulse: 83  Resp: 16  Temp: 98.1 F (36.7 C)  SpO2: 99%  Weight: 129 lb (58.5 kg)  Height: 5\' 4"  (1.626 m)    Body mass index is 22.14 kg/m.  Physical Exam  Constitutional: Patient appears well-developed and well-nourished.  No distress.  HEENT: head atraumatic, normocephalic, pupils equal and reactive to light,  neck supple Cardiovascular: Normal rate, regular rhythm and normal heart sounds.  No murmur heard. No BLE edema. Pulmonary/Chest: Effort normal and breath sounds normal. No respiratory distress. Abdominal: Soft.  There is no tenderness. Psychiatric: Patient has a normal mood and affect. behavior is normal. Judgment and thought content normal.   PHQ2/9: Depression screen [facility] 2/9 12/07/2021 11/27/2021 11/21/2021 04/25/2021 10/24/2020  Decreased Interest 0 0 0 0 0  Down, Depressed, Hopeless 0 0 0 0 0  PHQ - 2 Score 0 0 0 0 0  Altered sleeping 0 0 - - -  Tired, decreased energy 0 0 - - -  Change in appetite 0 0 - - -  Feeling bad or failure about yourself  0 0 - - -  Trouble concentrating 0 0 - - -  Moving  slowly or fidgety/restless 0 0 - - -  Suicidal thoughts 0 0 - - -  PHQ-9 Score 0 0 - - -  Difficult doing work/chores - Not difficult at all - - -    phq 9 is negative   Fall Risk: Fall Risk  12/07/2021 11/27/2021 11/21/2021 04/25/2021 10/24/2020  Falls in the past year? 0 0 0 0 0  Number falls in past yr: 0 0 0 0 0  Injury with Fall? 0 0 - 0 0  Risk for fall due to : No Fall Risks - - - No Fall Risks  Follow  up Falls prevention discussed - Falls prevention discussed - Falls prevention discussed      Functional Status Survey: Is the patient deaf or have difficulty hearing?: No Does the patient have difficulty seeing, even when wearing glasses/contacts?: No Does the patient have difficulty concentrating, remembering, or making decisions?: No Does the patient have difficulty walking or climbing stairs?: No Does the patient have difficulty dressing or bathing?: No Does the patient have difficulty doing errands alone such as visiting a doctor's office or shopping?: No    Assessment & Plan 1. Hypothyroidism, adult  - TSH - levothyroxine (SYNTHROID) 25 MCG tablet; TAKE 1 TABLET(25 MCG) BY MOUTH DAILY BEFORE AND BREAKFAST  Dispense: 90 tablet; Refill: 0  2. Age-related osteoporosis without current pathological fracture  - DG Bone Density; Future  3. Dyslipidemia  - Lipid panel  4. Venous stasis dermatitis of left lower extremity   5. White coat syndrome without diagnosis of hypertension   6. Mild cognitive impairment   7. Post herpetic neuralgia  - lidocaine (LIDODERM) 5 %; Place 2 patches onto the skin daily. Remove & Discard patch within 12 hours or as directed by MD  Dispense: 180 patch; Refill: 1  8. Long-term use of high-risk medication  - CBC with Differential/Platelet - COMPLETE METABOLIC PANEL WITH GFR  9. Breast cancer screening by mammogram  - MM 3D SCREEN BREAST BILATERAL; Future

## 2021-12-07 ENCOUNTER — Ambulatory Visit (INDEPENDENT_AMBULATORY_CARE_PROVIDER_SITE_OTHER): Payer: Medicare Other | Admitting: Family Medicine

## 2021-12-07 ENCOUNTER — Encounter: Payer: Self-pay | Admitting: Family Medicine

## 2021-12-07 VITALS — BP 128/70 | HR 83 | Temp 98.1°F | Resp 16 | Ht 64.0 in | Wt 129.0 lb

## 2021-12-07 DIAGNOSIS — E785 Hyperlipidemia, unspecified: Secondary | ICD-10-CM

## 2021-12-07 DIAGNOSIS — M810 Age-related osteoporosis without current pathological fracture: Secondary | ICD-10-CM | POA: Diagnosis not present

## 2021-12-07 DIAGNOSIS — I872 Venous insufficiency (chronic) (peripheral): Secondary | ICD-10-CM

## 2021-12-07 DIAGNOSIS — Z79899 Other long term (current) drug therapy: Secondary | ICD-10-CM

## 2021-12-07 DIAGNOSIS — E039 Hypothyroidism, unspecified: Secondary | ICD-10-CM | POA: Diagnosis not present

## 2021-12-07 DIAGNOSIS — G3184 Mild cognitive impairment, so stated: Secondary | ICD-10-CM

## 2021-12-07 DIAGNOSIS — B0229 Other postherpetic nervous system involvement: Secondary | ICD-10-CM

## 2021-12-07 DIAGNOSIS — Z1231 Encounter for screening mammogram for malignant neoplasm of breast: Secondary | ICD-10-CM | POA: Diagnosis not present

## 2021-12-07 DIAGNOSIS — R030 Elevated blood-pressure reading, without diagnosis of hypertension: Secondary | ICD-10-CM | POA: Diagnosis not present

## 2021-12-07 MED ORDER — LEVOTHYROXINE SODIUM 25 MCG PO TABS: ORAL_TABLET | ORAL | 0 refills | Status: DC

## 2021-12-07 MED ORDER — LIDOCAINE 5 % EX PTCH: 2.0000 | MEDICATED_PATCH | CUTANEOUS | 1 refills | Status: DC

## 2021-12-08 LAB — CBC WITH DIFFERENTIAL/PLATELET
Absolute Monocytes: 390 cells/uL (ref 200–950)
Basophils Absolute: 31 cells/uL (ref 0–200)
Basophils Relative: 0.5 %
Eosinophils Absolute: 92 cells/uL (ref 15–500)
Eosinophils Relative: 1.5 %
HCT: 36.1 % (ref 35.0–45.0)
Hemoglobin: 12 g/dL (ref 11.7–15.5)
Lymphs Abs: 1836 cells/uL (ref 850–3900)
MCH: 31.1 pg (ref 27.0–33.0)
MCHC: 33.2 g/dL (ref 32.0–36.0)
MCV: 93.5 fL (ref 80.0–100.0)
MPV: 9.4 fL (ref 7.5–12.5)
Monocytes Relative: 6.4 %
Neutro Abs: 3752 cells/uL (ref 1500–7800)
Neutrophils Relative %: 61.5 %
Platelets: 277 10*3/uL (ref 140–400)
RBC: 3.86 10*6/uL (ref 3.80–5.10)
RDW: 12.6 % (ref 11.0–15.0)
Total Lymphocyte: 30.1 %
WBC: 6.1 10*3/uL (ref 3.8–10.8)

## 2021-12-08 LAB — COMPLETE METABOLIC PANEL WITH GFR
AG Ratio: 1.3 (calc) (ref 1.0–2.5)
ALT: 17 U/L (ref 6–29)
AST: 28 U/L (ref 10–35)
Albumin: 4.2 g/dL (ref 3.6–5.1)
Alkaline phosphatase (APISO): 56 U/L (ref 37–153)
BUN: 10 mg/dL (ref 7–25)
CO2: 29 mmol/L (ref 20–32)
Calcium: 9.8 mg/dL (ref 8.6–10.4)
Chloride: 106 mmol/L (ref 98–110)
Creat: 0.88 mg/dL (ref 0.60–1.00)
Globulin: 3.2 g/dL (calc) (ref 1.9–3.7)
Glucose, Bld: 90 mg/dL (ref 65–99)
Potassium: 5.1 mmol/L (ref 3.5–5.3)
Sodium: 142 mmol/L (ref 135–146)
Total Bilirubin: 1 mg/dL (ref 0.2–1.2)
Total Protein: 7.4 g/dL (ref 6.1–8.1)
eGFR: 69 mL/min/{1.73_m2} (ref >=60)

## 2021-12-08 LAB — LIPID PANEL
Cholesterol: 123 mg/dL (ref <200)
HDL: 56 mg/dL (ref >=50)
LDL Cholesterol (Calc): 50 mg/dL (calc)
Non-HDL Cholesterol (Calc): 67 mg/dL (calc) (ref <130)
Total CHOL/HDL Ratio: 2.2 (calc) (ref <5.0)
Triglycerides: 89 mg/dL (ref <150)

## 2021-12-08 LAB — TSH: TSH: 3.99 mIU/L (ref 0.40–4.50)

## 2021-12-11 ENCOUNTER — Ambulatory Visit: Payer: Medicare Other

## 2021-12-19 ENCOUNTER — Ambulatory Visit (INDEPENDENT_AMBULATORY_CARE_PROVIDER_SITE_OTHER): Payer: Medicare Other

## 2021-12-19 DIAGNOSIS — E785 Hyperlipidemia, unspecified: Secondary | ICD-10-CM

## 2021-12-19 NOTE — Chronic Care Management (AMB) (Signed)
Chronic Care Management   CCM RN Visit Note  12/19/2021 Name: Lauren Eaton MRN: [id number] DOB: [birth year]  Subjective: Lauren Eaton is a 76 y.o. year old female who is a primary care patient of [name], MD. The care management team was consulted for assistance with disease management and care coordination needs.    Engaged with patient by telephone for follow up visit in response to provider referral for case management and care coordination services.   Consent to Services:  The patient was given information about Chronic Care Management services, agreed to services, and gave verbal consent prior to initiation of services.  Please see initial visit note for detailed documentation.    Assessment: Review of patient past medical history, allergies, medications, health status, including review of consultants reports, laboratory and other test data, was performed as part of comprehensive evaluation and provision of chronic care management services.   SDOH (Social Determinants of Health) assessments and interventions performed: No  CCM Care Plan  No Known Allergies  Outpatient Encounter Medications as of 12/19/2021  Medication Sig   alendronate (FOSAMAX) 70 MG tablet TAKE 1 TABLET(70 MG) BY MOUTH 1 TIME A WEEK WITH A FULL GLASS OF WATER AND ON AN EMPTY STOMACH   aspirin EC 81 MG tablet Take 1 tablet (81 mg total) by mouth daily.   atorvastatin (LIPITOR) 40 MG tablet Take 1 tablet (40 mg total) by mouth daily.   calcium-vitamin D (OSCAL-500) 500-400 MG-UNIT tablet Take 1 tablet by mouth daily.   donepezil (ARICEPT) 10 MG tablet TAKE 1 TABLET(10 MG) BY MOUTH AT BEDTIME   hydrocortisone 1 % ointment Apply 1 application topically 2 (two) times daily.   levothyroxine (SYNTHROID) 25 MCG tablet TAKE 1 TABLET(25 MCG) BY MOUTH DAILY BEFORE AND BREAKFAST   lidocaine (LIDODERM) 5 % Place 2 patches onto the skin daily. Remove & Discard patch within 12 hours or as directed by MD   loratadine  (CLARITIN) 10 MG tablet Take 1 tablet (10 mg total) by mouth daily.   Multiple Vitamin (MULTI-VITAMINS) TABS Take 1 tablet by mouth daily.   No facility-administered encounter medications on file as of 12/19/2021.    Patient Active Problem List   Diagnosis Date Noted   Behavioral change 08/09/2016   Memory changes 08/09/2016   Coccyalgia 07/29/2015   Abnormal thyroid function test 07/20/2010   Dyslipidemia 07/20/2010   OP (osteoporosis) 07/26/2009   History of anemia 07/20/2009    Patient Care Plan: RN Care Management Plan of Care     Problem Identified: Dyslipidemia, Osteoporosis      Long-Range Goal: Disease Progression Prevented or Minimized   Start Date: 11/21/2021  Expected End Date: 02/19/2022  Priority: High  Note:   Current Barriers:  Chronic Disease Management support and education needs related to HLD and Osteoporosis   RNCM Clinical Goal(s):  Patient will demonstrate ongoing adherence to prescribed treatment plan for HLD and Osteoporosis through collaboration with  the provider RN Care Manager and care management team.  Interventions: 1:1 collaboration with primary care provider regarding development and update of comprehensive plan of care as evidenced by provider attestation and co-signature Inter-disciplinary care team collaboration (see longitudinal plan of care) Evaluation of current treatment plan related to  self management and patient's adherence to plan as established by provider   Hyperlipidemia Interventions:  (Status:  Goal on track:  Yes.) Long Term Goal Lab Results  Component Value Date   CHOL 123 10/20/2020   HDL 54 10/20/2020   LDLCALC 49 10/20/2020  TRIG 122 10/20/2020   CHOLHDL 2.3 10/20/2020   Lab Results  Component Value Date   CHOL 123 12/07/2021   HDL 56 12/07/2021   LDLCALC 50 12/07/2021   TRIG 89 12/07/2021   CHOLHDL 2.2 12/07/2021    Medications reviewed. Reports taking as prescribed. Tolerating regimen well.  Reviewed provider  established cholesterol goals. Levels remain within range. Discussed importance of regular laboratory monitoring as prescribed. Reviewed importance of limiting foods high in cholesterol. Reports doing well with nutritional intake and maintaining a heart healthy/cardiac prudent diet. Engages in low impact activity regularly. Reviewed s/sx of heart attack, stroke and indications for seeking immediate medical attention.  Patient Goals/Self-Care Activities: Take all medications as prescribed Attend all scheduled provider appointments Call pharmacy for medication refills 3-7 days in advance of running out of medications Perform all self care activities independently  Perform IADL's (shopping, preparing meals, housekeeping, managing finances) independently Call provider office for new concerns or questions    Follow Up Plan:   Will follow up in two months       PLAN A member of the care management team will follow up in two months.   [facility] Care Management [facility] [phone]

## 2021-12-19 NOTE — Patient Instructions (Signed)
Thank you for allowing the Chronic Care Management team to participate in your care. It was great speaking with you today! °

## 2022-01-01 ENCOUNTER — Other Ambulatory Visit: Payer: Self-pay | Admitting: Family Medicine

## 2022-01-01 DIAGNOSIS — E785 Hyperlipidemia, unspecified: Secondary | ICD-10-CM

## 2022-01-15 DIAGNOSIS — E785 Hyperlipidemia, unspecified: Secondary | ICD-10-CM

## 2022-01-16 ENCOUNTER — Telehealth: Payer: Self-pay | Admitting: Family Medicine

## 2022-01-16 NOTE — Telephone Encounter (Signed)
Copied from [place] [phone]. Topic: Medicare AWV >> Jan 16, 2022 10:18 AM Cher Nakai R wrote: Reason for CRM:  Left message for patient to call back and schedule Medicare Annual Wellness Visit (AWV) in office.   If unable to come into the office for AWV,  please offer to do virtually or by telephone.  Last AWV: 10/24/2020  Please schedule at anytime with [place].  40 minute appointment  Any questions, please contact me at [phone]

## 2022-02-19 ENCOUNTER — Telehealth: Payer: Self-pay | Admitting: Family Medicine

## 2022-02-19 NOTE — Telephone Encounter (Signed)
Copied from CRM [phone]. Topic: Medicare AWV ?>> Feb 19, 2022  2:43 PM Claudette Laws R wrote: ?Reason for CRM:  ?Left message for patient to call back and schedule Medicare Annual Wellness Visit (AWV) in office.  ? ?If unable to come into the office for AWV,  please offer to do virtually or by telephone. ? ?Last AWV: 10/24/2020 ? ?Please schedule at anytime with [facility] Health Advisor. ? ?40 minute appointment ? ?Any questions, please contact me at [phone] ?

## 2022-02-28 ENCOUNTER — Ambulatory Visit (INDEPENDENT_AMBULATORY_CARE_PROVIDER_SITE_OTHER): Payer: Medicare Other

## 2022-02-28 NOTE — Patient Instructions (Signed)
Thank you for allowing the Chronic Care Management team to participate in your care.  

## 2022-02-28 NOTE — Chronic Care Management (AMB) (Signed)
?Chronic Care Management  ? ?CCM RN Visit Note ? ?02/28/2022 ?Name: Lauren Eaton MRN: [id number] DOB: 01-Jun-1946 ? ?Subjective: ?Lauren Eaton is a 76 y.o. year old female who is a primary care patient of Lauren Sizer, MD. The care management team was consulted for assistance with disease management and care coordination needs.   ? ?Engaged with patient by telephone for follow up visit in response to provider referral for case management and care coordination services.  ? ?Consent to Services:  ?The patient was given information about Chronic Care Management services, agreed to services, and gave verbal consent prior to initiation of services.  Please see initial visit note for detailed documentation.  ? ?Assessment: Review of patient past medical history, allergies, medications, health status, including review of consultants reports, laboratory and other test data, was performed as part of comprehensive evaluation and provision of chronic care management services.  ? ?SDOH (Social Determinants of Health) assessments and interventions performed: No ? ?CCM Care Plan ? ?No Known Allergies ? ?Outpatient Encounter Medications as of 02/28/2022  ?Medication Sig  ? alendronate (FOSAMAX) 70 MG tablet TAKE 1 TABLET(70 MG) BY MOUTH 1 TIME A WEEK WITH A FULL GLASS OF WATER AND ON AN EMPTY STOMACH  ? aspirin EC 81 MG tablet Take 1 tablet (81 mg total) by mouth daily.  ? atorvastatin (LIPITOR) 40 MG tablet TAKE 1 TABLET(40 MG) BY MOUTH DAILY  ? calcium-vitamin D (OSCAL-500) 500-400 MG-UNIT tablet Take 1 tablet by mouth daily.  ? donepezil (ARICEPT) 10 MG tablet TAKE 1 TABLET(10 MG) BY MOUTH AT BEDTIME  ? hydrocortisone 1 % ointment Apply 1 application topically 2 (two) times daily.  ? levothyroxine (SYNTHROID) 25 MCG tablet TAKE 1 TABLET(25 MCG) BY MOUTH DAILY BEFORE AND BREAKFAST  ? lidocaine (LIDODERM) 5 % Place 2 patches onto the skin daily. Remove & Discard patch within 12 hours or as directed by MD  ? loratadine  (CLARITIN) 10 MG tablet Take 1 tablet (10 mg total) by mouth daily.  ? Multiple Vitamin (MULTI-VITAMINS) TABS Take 1 tablet by mouth daily.  ? ?No facility-administered encounter medications on file as of 02/28/2022.  ? ? ?Patient Active Problem List  ? Diagnosis Date Noted  ? Behavioral change 08/09/2016  ? Memory changes 08/09/2016  ? Coccyalgia 07/29/2015  ? Abnormal thyroid function test 07/20/2010  ? Dyslipidemia 07/20/2010  ? OP (osteoporosis) 07/26/2009  ? History of anemia 07/20/2009  ? ? ? ?Patient Care Plan: RN Care Management Plan of Care  ?  ? ?Problem Identified: Dyslipidemia, Osteoporosis   ?  ? ?Long-Range Goal: Disease Progression Prevented or Minimized Completed 02/28/2022  ?Start Date: 11/21/2021  ?Expected End Date: 02/19/2022  ?Priority: High  ?Note:   ?Current Barriers:  ?Chronic Disease Management support and education needs related to HLD and Osteoporosis  ? ?RNCM Clinical Goal(s):  ?Patient will demonstrate ongoing adherence to prescribed treatment plan for HLD and Osteoporosis through collaboration with  the provider RN Care Manager and care management team. ? ?Interventions: ?1:1 collaboration with primary care provider regarding development and update of comprehensive plan of care as evidenced by provider attestation and co-signature ?Inter-disciplinary care team collaboration (see longitudinal plan of care) ?Evaluation of current treatment plan related to  self management and patient's adherence to plan as established by provider ? ? ?Hyperlipidemia Interventions:   ?Lab Results  ?Component Value Date  ? CHOL 123 10/20/2020  ? HDL 54 10/20/2020  ? LDLCALC 49 10/20/2020  ? TRIG 122 10/20/2020  ? CHOLHDL 2.3 10/20/2020  ? ?  Lab Results  ?Component Value Date  ? CHOL 123 12/07/2021  ? HDL 56 12/07/2021  ? [place] 50 12/07/2021  ? TRIG 89 12/07/2021  ? CHOLHDL 2.2 12/07/2021  ?  ?Reviewed plan for hyperlipidemia management. ?Reviewed medications. Reports excellent compliance with  medications. ?Reviewed established cholesterol goals. Advised to continue completing labs as prescribed.  ?Discussed nutritional intake. Reports attempting to adhere to a heart healthy/cardiac prudent diet. Advised to continue limiting foods high in cholesterol. Advised to read nutrition labels and avoid highly processed foods when possible.  ?Reviewed s/sx of heart attack, stroke and indications for seeking immediate medical attention. ? ? ?Patient Goals/Self-Care Activities: ?Take all medications as prescribed ?Attend all scheduled provider appointments ?Call pharmacy for medication refills 3-7 days in advance of running out of medications ?Perform all self care activities independently  ?Perform IADL's (shopping, preparing meals, housekeeping, managing finances) independently ?Call provider office for new concerns or questions  ? ?  ?  ? ?PLAN ?[name] has met her nurse care management goals. Agreed to call if she requires additional outreach. The care management team will gladly assist. ? ? ?Madysen Faircloth,RN ?[facility] Care Management ?[facility] ?[phone] ? ? ? ? ? ? ? ?

## 2022-03-14 ENCOUNTER — Ambulatory Visit (INDEPENDENT_AMBULATORY_CARE_PROVIDER_SITE_OTHER): Payer: Medicare Other

## 2022-03-14 DIAGNOSIS — Z0000 Encounter for general adult medical examination without abnormal findings: Secondary | ICD-10-CM | POA: Diagnosis not present

## 2022-03-14 NOTE — Progress Notes (Signed)
? ?Subjective:  ? Lauren Eaton is a 76 y.o. female who presents for Medicare Annual (Subsequent) preventive examination. ? ?Virtual Visit via Telephone Note ? ?I connected with  Lauren Eaton on 03/14/22 at 11:45 AM EDT by telephone and verified that I am speaking with the correct person using two identifiers. ? ?Location: ?Patient: home ?Provider: [facility] ?Persons participating in the virtual visit: patient/Nurse Health Advisor ?  ?I discussed the limitations, risks, security and privacy concerns of performing an evaluation and management service by telephone and the availability of in person appointments. The patient expressed understanding and agreed to proceed. ? ?Interactive audio and video telecommunications were attempted between this nurse and patient, however failed, due to patient having technical difficulties OR patient did not have access to video capability.  We continued and completed visit with audio only. ? ?Some vital signs may be absent or patient reported.  ? ?Lauren Marker, LPN ? ? ?Review of Systems    ? ?Cardiac Risk Factors include: advanced age (>55men, >60 women);dyslipidemia ? ?   ?Objective:  ?  ?There were no vitals filed for this visit. ?There is no height or weight on file to calculate BMI. ? ? ?  03/14/2022  ? 11:48 AM 10/24/2020  ? 10:29 AM 10/21/2019  ? 10:13 AM 06/19/2017  ?  8:44 AM 04/01/2017  ?  9:20 PM 01/27/2017  ?  2:53 PM 08/09/2016  ?  9:34 AM  ?Advanced Directives  ?Does Patient Have a Medical Advance Directive? No No No No No No No  ?Would patient like information on creating a medical advance directive? No - Patient declined Yes (MAU/Ambulatory/Procedural Areas - Information given) Yes (MAU/Ambulatory/Procedural Areas - Information given)  No - Patient declined  No - patient declined information  ? ? ?Current Medications (verified) ?Outpatient Encounter Medications as of 03/14/2022  ?Medication Sig  ? alendronate (FOSAMAX) 70 MG tablet TAKE 1 TABLET(70 MG) BY MOUTH 1 TIME A WEEK WITH  A FULL GLASS OF WATER AND ON AN EMPTY STOMACH  ? aspirin EC 81 MG tablet Take 1 tablet (81 mg total) by mouth daily.  ? atorvastatin (LIPITOR) 40 MG tablet TAKE 1 TABLET(40 MG) BY MOUTH DAILY  ? calcium-vitamin D (OSCAL-500) 500-400 MG-UNIT tablet Take 1 tablet by mouth daily.  ? donepezil (ARICEPT) 10 MG tablet TAKE 1 TABLET(10 MG) BY MOUTH AT BEDTIME  ? hydrocortisone 1 % ointment Apply 1 application topically 2 (two) times daily.  ? levothyroxine (SYNTHROID) 25 MCG tablet TAKE 1 TABLET(25 MCG) BY MOUTH DAILY BEFORE AND BREAKFAST  ? lidocaine (LIDODERM) 5 % Place 2 patches onto the skin daily. Remove & Discard patch within 12 hours or as directed by MD  ? loratadine (CLARITIN) 10 MG tablet Take 1 tablet (10 mg total) by mouth daily.  ? Multiple Vitamin (MULTI-VITAMINS) TABS Take 1 tablet by mouth daily.  ? ?No facility-administered encounter medications on file as of 03/14/2022.  ? ? ?Allergies (verified) ?Patient has no known allergies.  ? ?History: ?Past Medical History:  ?Diagnosis Date  ? Abnormal finding on thyroid function test   ? recheck labs   ? Abnormal glucose   ? Chronic insomnia   ? Dyslipidemia   ? onset 07/20/10  ? History of anemia   ? Hyperlipidemia   ? Mild cognitive impairment   ? Osteoporosis   ? she took forteo in 2012, but has been off medication, bone density is still showing osteoporosis, refer to rheumatologist  ? Vitamin D deficiency   ? ?Past  Surgical History:  ?Procedure Laterality Date  ? BREAST BIOPSY Right 11/16/2019  ? rt mass stereo bx path pending x clip  ? SURGERY OF LIP    ? ?Family History  ?Problem Relation Age of Onset  ? Alzheimer's disease Mother   ? Dementia Mother   ? Early death Father   ?     MVA  ? CVA Sister 65  ?     complications of  ? Cancer Brother   ?     stomach  ? Cancer Brother   ?     lung  ? Breast cancer Neg Hx   ? ?Social History  ? ?Socioeconomic History  ? Marital status: Single  ?  Spouse name: Not on file  ? Number of children: 1  ? Years of education: 4   ? Highest education level: Not on file  ?Occupational History  ? 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  Employer: [employer]  ?  Comment: retired 2012  ?Tobacco Use  ? Smoking status: Never  ? Smokeless tobacco: Former  ?  Types: Snuff  ? Tobacco comments:  ?  Used snuff, dips 3-4 dips/day, has used it 40-50 years. quit 10/2014.  ?Vaping Use  ? Vaping Use: Never used  ?Substance and Sexual Activity  ? Alcohol use: No  ?  Alcohol/week: 0.0 standard drinks  ? Drug use: No  ? Sexual activity: Not Currently  ?Other Topics Concern  ? Not on file  ?Social History Narrative  ? Lives alone in a senior citizen apartment, has friends and likes to take care of her plants and go out with her friend/neighbors.   ? Still able to drive - but only grocery stores and church.   ? Currently watching her great-grandson - infant  ? Caffeine use: Soda daily  ? Right handed  ? ?Social Determinants of Health  ? ?Financial Resource Strain: Low Risk   ? Difficulty of Paying Living Expenses: Not hard at all  ?Food Insecurity: No Food Insecurity  ? Worried About Charity fundraiser in the Last Year: Never true  ? Ran Out of Food in the Last Year: Never true  ?Transportation Needs: No Transportation Needs  ? Lack of Transportation (Medical): No  ? Lack of Transportation (Non-Medical): No  ?Physical Activity: Sufficiently Active  ? Days of Exercise per Week: 5 days  ? Minutes of Exercise per Session: 50 min  ?Stress: No Stress Concern Present  ? Feeling of Stress : Not at all  ?Social Connections: Moderately Isolated  ? Frequency of Communication with Friends and Family: More than three times a week  ? Frequency of Social Gatherings with Friends and Family: More than three times a week  ? Attends Religious Services: More than 4 times per year  ? Active Member of Clubs or Organizations: No  ? Attends Archivist Meetings: Never  ? Marital Status: Widowed  ? ? ?Tobacco Counseling ?Counseling given: Not Answered ?Tobacco comments: Used  snuff, dips 3-4 dips/day, has used it 40-50 years. quit 10/2014. ? ? ?Clinical Intake: ? ?Pre-visit preparation completed: Yes ? ?Pain : No/denies pain ? ?  ? ?Nutritional Risks: None ?Diabetes: No ? ?How often do you need to have someone help you when you read instructions, pamphlets, or other written materials from your doctor or pharmacy?: 1 - Never ? ? ? ?Interpreter Needed?: No ? ?Information entered by :: Lauren Marker LPN ? ? ?Activities of Daily Living ? ?  03/14/2022  ? 11:49  AM 12/07/2021  ? 10:01 AM  ?In your present state of health, do you have any difficulty performing the following activities:  ?Hearing? 0 0  ?Vision? 0 0  ?Difficulty concentrating or making decisions? 0 0  ?Walking or climbing stairs? 0 0  ?Dressing or bathing? 0 0  ?Doing errands, shopping? 0 0  ?Preparing Food and eating ? N   ?Using the Toilet? N   ?In the past six months, have you accidently leaked urine? N   ?Do you have problems with loss of bowel control? N   ?Managing your Medications? N   ?Managing your Finances? N   ?Housekeeping or managing your Housekeeping? N   ? ? ?Patient Care Team: ?Steele Sizer, MD as PCP - General (Family Medicine) ?Neldon Labella, RN as Case Manager ? ?Indicate any recent Medical Services you may have received from other than [facility] providers in the past year (date may be approximate). ? ?   ?Assessment:  ? This is a routine wellness examination for Glennys. ? ?Hearing/Vision screen ?Hearing Screening - Comments:: Pt denies hearing difficulty ?Vision Screening - Comments:: Annual vision screenings at [facility] ? ?Dietary issues and exercise activities discussed: ?Current Exercise Habits: Home exercise routine, Type of exercise: calisthenics;stretching, Time (Minutes): 50, Frequency (Times/Week): 5, Weekly Exercise (Minutes/Week): 250, Intensity: Moderate, Exercise limited by: None identified ? ? Goals Addressed   ? ?  ?  ?  ?  ? This Visit's Progress  ?  Patient Stated   On track  ?  Remain  healthy and active ?  ? ?  ? ?Depression Screen ? ?  03/14/2022  ? 11:47 AM 12/07/2021  ? 10:01 AM 11/27/2021  ?  3:10 PM 11/21/2021  ? 10:28 AM 04/25/2021  ? 11:42 AM 10/24/2020  ? 10:28 AM 10/20/2020  ?  3:

## 2022-03-14 NOTE — Patient Instructions (Signed)
[name] , ?Thank you for taking time to come for your Medicare Wellness Visit. I appreciate your ongoing commitment to your health goals. Please review the following plan we discussed and let me know if I can assist you in the future.  ? ?Screening recommendations/referrals: ?Colonoscopy: done 2010. Due for repeat screening.  ?Mammogram: done11/9/20. Please call [phone] to schedule your mammogram and bone density screening.  ?Bone Density: done 10/25/19 ?Recommended yearly ophthalmology/optometry visit for glaucoma screening and checkup ?Recommended yearly dental visit for hygiene and checkup ? ?Vaccinations: ?Influenza vaccine: done 11/27/21 ?Pneumococcal vaccine: done 01/27/17 ?Tdap vaccine: due ?Shingles vaccine: done 08/21/17 & 10/25/17   ?Covid-19:done 02/03/20, 03/02/20 & 01/01/21 ? ?Advanced directives: Advance directive discussed with you today. Even though you declined this today please call our office should you change your mind and we can give you the proper paperwork for you to fill out. ? ?Conditions/risks identified: Keep up the great work! ? ?Next appointment: Follow up in one year for your annual wellness visit  ? ? ?Preventive Care 27 Years and Older, Female ?Preventive care refers to lifestyle choices and visits with your health care provider that can promote health and wellness. ?What does preventive care include? ?A yearly physical exam. This is also called an annual well check. ?Dental exams once or twice a year. ?Routine eye exams. Ask your health care provider how often you should have your eyes checked. ?Personal lifestyle choices, including: ?Daily care of your teeth and gums. ?Regular physical activity. ?Eating a healthy diet. ?Avoiding tobacco and drug use. ?Limiting alcohol use. ?Practicing safe sex. ?Taking low-dose aspirin every day. ?Taking vitamin and mineral supplements as recommended by your health care provider. ?What happens during an annual well check? ?The services and  screenings done by your health care provider during your annual well check will depend on your age, overall health, lifestyle risk factors, and family history of disease. ?Counseling  ?Your health care provider may ask you questions about your: ?Alcohol use. ?Tobacco use. ?Drug use. ?Emotional well-being. ?Home and relationship well-being. ?Sexual activity. ?Eating habits. ?History of falls. ?Memory and ability to understand (cognition). ?Work and work Astronomer. ?Reproductive health. ?Screening  ?You may have the following tests or measurements: ?Height, weight, and BMI. ?Blood pressure. ?Lipid and cholesterol levels. These may be checked every 5 years, or more frequently if you are over 56 years old. ?Skin check. ?Lung cancer screening. You may have this screening every year starting at age 57 if you have a 30-pack-year history of smoking and currently smoke or have quit within the past 15 years. ?Fecal occult blood test (FOBT) of the stool. You may have this test every year starting at age 75. ?Flexible sigmoidoscopy or colonoscopy. You may have a sigmoidoscopy every 5 years or a colonoscopy every 10 years starting at age 20. ?Hepatitis C blood test. ?Hepatitis B blood test. ?Sexually transmitted disease (STD) testing. ?Diabetes screening. This is done by checking your blood sugar (glucose) after you have not eaten for a while (fasting). You may have this done every 1-3 years. ?Bone density scan. This is done to screen for osteoporosis. You may have this done starting at age 85. ?Mammogram. This may be done every 1-2 years. Talk to your health care provider about how often you should have regular mammograms. ?Talk with your health care provider about your test results, treatment options, and if necessary, the need for more tests. ?Vaccines  ?Your health care provider may recommend certain vaccines, such as: ?Influenza vaccine. This  is recommended every year. ?Tetanus, diphtheria, and acellular pertussis (Tdap,  Td) vaccine. You may need a Td booster every 10 years. ?Zoster vaccine. You may need this after age 82. ?Pneumococcal 13-valent conjugate (PCV13) vaccine. One dose is recommended after age 63. ?Pneumococcal polysaccharide (PPSV23) vaccine. One dose is recommended after age 56. ?Talk to your health care provider about which screenings and vaccines you need and how often you need them. ?This information is not intended to replace advice given to you by your health care provider. Make sure you discuss any questions you have with your health care provider. ?Document Released: 12/29/2015 Document Revised: 08/21/2016 Document Reviewed: 10/03/2015 ?Elsevier Interactive Patient Education ? 2017 [employer]. ? ?Fall Prevention in the Home ?Falls can cause injuries. They can happen to people of all ages. There are many things you can do to make your home safe and to help prevent falls. ?What can I do on the outside of my home? ?Regularly fix the edges of walkways and driveways and fix any cracks. ?Remove anything that might make you trip as you walk through a door, such as a raised step or threshold. ?Trim any bushes or trees on the path to your home. ?Use bright outdoor lighting. ?Clear any walking paths of anything that might make someone trip, such as rocks or tools. ?Regularly check to see if handrails are loose or broken. Make sure that both sides of any steps have handrails. ?Any raised decks and porches should have guardrails on the edges. ?Have any leaves, snow, or ice cleared regularly. ?Use sand or salt on walking paths during winter. ?Clean up any spills in your garage right away. This includes oil or grease spills. ?What can I do in the bathroom? ?Use night lights. ?Install grab bars by the toilet and in the tub and shower. Do not use towel bars as grab bars. ?Use non-skid mats or decals in the tub or shower. ?If you need to sit down in the shower, use a plastic, non-slip stool. ?Keep the floor dry. Clean up any  water that spills on the floor as soon as it happens. ?Remove soap buildup in the tub or shower regularly. ?Attach bath mats securely with double-sided non-slip rug tape. ?Do not have throw rugs and other things on the floor that can make you trip. ?What can I do in the bedroom? ?Use night lights. ?Make sure that you have a light by your bed that is easy to reach. ?Do not use any sheets or blankets that are too big for your bed. They should not hang down onto the floor. ?Have a firm chair that has side arms. You can use this for support while you get dressed. ?Do not have throw rugs and other things on the floor that can make you trip. ?What can I do in the kitchen? ?Clean up any spills right away. ?Avoid walking on wet floors. ?Keep items that you use a lot in easy-to-reach places. ?If you need to reach something above you, use a strong step stool that has a grab bar. ?Keep electrical cords out of the way. ?Do not use floor polish or wax that makes floors slippery. If you must use wax, use non-skid floor wax. ?Do not have throw rugs and other things on the floor that can make you trip. ?What can I do with my stairs? ?Do not leave any items on the stairs. ?Make sure that there are handrails on both sides of the stairs and use them. Fix handrails that  are broken or loose. Make sure that handrails are as long as the stairways. ?Check any carpeting to make sure that it is firmly attached to the stairs. Fix any carpet that is loose or worn. ?Avoid having throw rugs at the top or bottom of the stairs. If you do have throw rugs, attach them to the floor with carpet tape. ?Make sure that you have a light switch at the top of the stairs and the bottom of the stairs. If you do not have them, ask someone to add them for you. ?What else can I do to help prevent falls? ?Wear shoes that: ?Do not have high heels. ?Have rubber bottoms. ?Are comfortable and fit you well. ?Are closed at the toe. Do not wear sandals. ?If you use a  stepladder: ?Make sure that it is fully opened. Do not climb a closed stepladder. ?Make sure that both sides of the stepladder are locked into place. ?Ask someone to hold it for you, if possible. ?Clearly mark

## 2022-03-15 DIAGNOSIS — E785 Hyperlipidemia, unspecified: Secondary | ICD-10-CM

## 2022-05-07 ENCOUNTER — Other Ambulatory Visit: Payer: Self-pay | Admitting: Family Medicine

## 2022-05-07 DIAGNOSIS — G3184 Mild cognitive impairment, so stated: Secondary | ICD-10-CM

## 2022-06-07 ENCOUNTER — Ambulatory Visit: Payer: Medicare Other | Admitting: Family Medicine

## 2022-07-04 ENCOUNTER — Other Ambulatory Visit: Payer: Self-pay | Admitting: Family Medicine

## 2022-07-04 DIAGNOSIS — E785 Hyperlipidemia, unspecified: Secondary | ICD-10-CM

## 2022-07-05 NOTE — Telephone Encounter (Signed)
Pt is scheduled for next Friday the 28th so go ahead and send in like in the message. Thanks

## 2022-07-11 NOTE — Progress Notes (Signed)
Name: Lauren Eaton   MRN: [id number]    DOB: 1946/04/07   Date:07/12/2022       Progress Note  Subjective  Chief Complaint  Follow Up  HPI  Osteoporosis: seen by Dr. Gavin Potters n August 2016, and was started on Fosamax, she had a gap, but compliant again .She took almost two years of Forteo back in 2012 and 2013.Bone density done  03/2015  still showed osteoporosis. She had a repeat bone density done 10/25/2019 and stable. She resumed Fosamax Fall 2020 , tolerating medication well, taking vitamin D supplementation last vitamin D level was at goal  We will recheck bone density    Hyperlipidemia: on statin therapy, last LDL 50, HD, she denies side effects of medication. 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 Lives alone. Marland Kitchen MMS initially was  29 she has been taking Aricept 10 mg and denies side effects  . Positive family history of Alzheimer's - mother, also has a sister with symptoms but not formally diagnosed with dementia. She was getting very frustrated with previous neighbor and was calling wicked, she moved out of the apartment complex, she initially still complained that  previous neighbors were bothering her  " they have devices and area sill inside of her house" , daughter states symptoms are stable, still gets forgetful but not at worse than usual, able to pay her own bills, has a social life with friends and still goes out to eat  Diarrhea: patient initially said bowel changes over the past 2 weeks but later said over the past few months. She has lost 15 lbs , daughter did not noticed the weight loss however previous 3 weights were between 128-130. She states appetite is normal, no abdominal pain or blood in stools. We will check labs to make sure not infectious cause of too much thyroid medication and send to GI if needed. 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   Abnormal thyroid function test 07/20/2010   Dyslipidemia 07/20/2010   OP (osteoporosis) 07/26/2009   History of anemia 07/20/2009    Past Surgical History:  Procedure Laterality Date   BREAST BIOPSY Right 11/16/2019   rt mass stereo bx path pending x clip   SURGERY OF LIP      Family History  Problem Relation Age of Onset   Alzheimer's disease Mother    Dementia Mother    Early death Father        MVA   CVA Sister 54       complications of   Cancer Brother        stomach   Cancer Brother        lung   Breast cancer Neg Hx     Social History   Tobacco Use   Smoking status: Never   Smokeless tobacco: Former    Types: Snuff   Tobacco comments:    Used snuff, dips 3-4 dips/day, has used it 40-50 years. quit 10/2014.  Substance Use Topics   Alcohol use: No    Alcohol/week: 0.0 standard drinks of alcohol     Current Outpatient Medications:  alendronate (FOSAMAX) 70 MG tablet, TAKE 1 TABLET(70 MG) BY MOUTH 1 TIME A WEEK WITH A FULL GLASS OF WATER AND ON AN EMPTY STOMACH, Disp: 12 tablet, Rfl: 5   aspirin EC 81 MG tablet, Take 1 tablet (81 mg total) by mouth daily., Disp: 30 tablet, Rfl: 0   atorvastatin (LIPITOR) 40 MG tablet, TAKE 1 TABLET(40 MG) BY MOUTH DAILY, Disp: 90 tablet, Rfl: 0   calcium-vitamin D (OSCAL-500) 500-400 MG-UNIT tablet, Take 1 tablet by mouth daily., Disp: , Rfl:    donepezil (ARICEPT) 10 MG tablet, TAKE 1 TABLET(10 MG) BY MOUTH AT BEDTIME, Disp: 90 tablet, Rfl: 0   hydrocortisone 1 % ointment, Apply 1 application topically 2 (two) times daily., Disp: 30 g, Rfl: 0   levothyroxine (SYNTHROID) 25 MCG tablet, TAKE 1 TABLET(25 MCG) BY MOUTH DAILY BEFORE AND BREAKFAST, Disp: 90  tablet, Rfl: 0   lidocaine (LIDODERM) 5 %, Place 2 patches onto the skin daily. Remove & Discard patch within 12 hours or as directed by MD, Disp: 180 patch, Rfl: 1   loratadine (CLARITIN) 10 MG tablet, Take 1 tablet (10 mg total) by mouth daily., Disp: 30 tablet, Rfl: 11   Multiple Vitamin (MULTI-VITAMINS) TABS, Take 1 tablet by mouth daily., Disp: , Rfl:   No Known Allergies  I personally reviewed active problem list, medication list, allergies, family history, social history, health maintenance with the patient/caregiver today.   ROS  Constitutional: Negative for fever or weight change.  Respiratory: Negative for cough and shortness of breath.   Cardiovascular: Negative for chest pain or palpitations.  Gastrointestinal: Negative for abdominal pain, no bowel changes.  Musculoskeletal: Negative for gait problem or joint swelling.  Skin: Negative for rash.  Neurological: Negative for dizziness or headache.  No other specific complaints in a complete review of systems (except as listed in HPI above).   Objective  Vitals:   07/12/22 0919  BP: 130/74  Pulse: 80  Resp: 18  Temp: 98 F (36.7 C)  TempSrc: Oral  SpO2: 99%  Weight: 114 lb 11.2 oz (52 kg)  Height: 5\' 4"  (1.626 m)    Body mass index is 19.69 kg/m.  Physical Exam  Constitutional: Patient appears well-developed and malnourished  No distress.  HEENT: head atraumatic, normocephalic, pupils equal and reactive to light, neck supple, throat within normal limits Cardiovascular: Normal rate, regular rhythm and normal heart sounds.  No murmur heard. No BLE edema. Pulmonary/Chest: Effort normal and breath sounds normal. No respiratory distress. Abdominal: Soft.  There is no tenderness. Increase bowel sounds , no masses  Psychiatric: Patient has a normal mood and affect. behavior is normal.   PHQ2/9:    07/12/2022    9:18 AM 03/14/2022   11:47 AM 12/07/2021   10:01 AM 11/27/2021    3:10 PM 11/21/2021   10:28 AM   Depression screen PHQ 2/9  Decreased Interest 0 0 0 0 0  Down, Depressed, Hopeless 0 0 0 0 0  PHQ - 2 Score 0 0 0 0 0  Altered sleeping 0  0 0   Tired, decreased energy 0  0 0   Change in appetite 0  0 0   Feeling bad or failure about yourself  0  0 0   Trouble concentrating 0  0 0   Moving slowly or fidgety/restless 0  0 0   Suicidal thoughts 0  0 0   PHQ-9 Score 0  0 0   Difficult doing work/chores Not difficult at all  Not difficult at all     phq 9 is negative   Fall Risk:    07/12/2022    9:18 AM 03/14/2022   11:48 AM 12/19/2021    9:58 AM 12/07/2021   10:01 AM 11/27/2021    3:11 PM  Fall Risk   Falls in the past year? 0 0 0 0 0  Number falls in past yr: 0 0 0 0 0  Injury with Fall? 0 0 0 0 0  Risk for fall due to : No Fall Risks No Fall Risks  No Fall Risks   Follow up Falls prevention discussed;Education provided Falls prevention discussed Falls prevention discussed Falls prevention discussed     Assessment & Plan  1. Recent change in frequency of bowel movements  - Lipase - GI pathogen panel by PCR, stool  2. Hypothyroidism, adult  - TSH  3. Mild protein-calorie malnutrition (HCC)  - CBC with Differential/Platelet - COMPLETE METABOLIC PANEL WITH GFR  4. Dyslipidemia   5. Age-related osteoporosis without current pathological fracture   6. Encounter for screening mammogram for malignant neoplasm of breast  - MM 3D SCREEN BREAST BILATERAL; Future  7. Mild cognitive impairment   8. Long-term use of high-risk medication  - CBC with Differential/Platelet - COMPLETE METABOLIC PANEL WITH GFR  9. Unintentional weight loss  - Sedimentation rate - C-reactive protein

## 2022-07-12 ENCOUNTER — Encounter: Payer: Self-pay | Admitting: Family Medicine

## 2022-07-12 ENCOUNTER — Ambulatory Visit (INDEPENDENT_AMBULATORY_CARE_PROVIDER_SITE_OTHER): Payer: Medicare Other | Admitting: Family Medicine

## 2022-07-12 VITALS — BP 130/74 | HR 80 | Temp 98.0°F | Resp 18 | Ht 64.0 in | Wt 114.7 lb

## 2022-07-12 DIAGNOSIS — E785 Hyperlipidemia, unspecified: Secondary | ICD-10-CM

## 2022-07-12 DIAGNOSIS — M810 Age-related osteoporosis without current pathological fracture: Secondary | ICD-10-CM

## 2022-07-12 DIAGNOSIS — R634 Abnormal weight loss: Secondary | ICD-10-CM

## 2022-07-12 DIAGNOSIS — E039 Hypothyroidism, unspecified: Secondary | ICD-10-CM

## 2022-07-12 DIAGNOSIS — E441 Mild protein-calorie malnutrition: Secondary | ICD-10-CM | POA: Diagnosis not present

## 2022-07-12 DIAGNOSIS — R194 Change in bowel habit: Secondary | ICD-10-CM | POA: Diagnosis not present

## 2022-07-12 DIAGNOSIS — G3184 Mild cognitive impairment, so stated: Secondary | ICD-10-CM

## 2022-07-12 DIAGNOSIS — Z1231 Encounter for screening mammogram for malignant neoplasm of breast: Secondary | ICD-10-CM

## 2022-07-12 DIAGNOSIS — Z79899 Other long term (current) drug therapy: Secondary | ICD-10-CM

## 2022-07-13 LAB — CBC WITH DIFFERENTIAL/PLATELET
Absolute Monocytes: 347 cells/uL (ref 200–950)
Basophils Absolute: 41 cells/uL (ref 0–200)
Basophils Relative: 0.8 %
Eosinophils Absolute: 31 cells/uL (ref 15–500)
Eosinophils Relative: 0.6 %
HCT: 37.8 % (ref 35.0–45.0)
Hemoglobin: 12.2 g/dL (ref 11.7–15.5)
Lymphs Abs: 1239 cells/uL (ref 850–3900)
MCH: 30.2 pg (ref 27.0–33.0)
MCHC: 32.3 g/dL (ref 32.0–36.0)
MCV: 93.6 fL (ref 80.0–100.0)
MPV: 9.8 fL (ref 7.5–12.5)
Monocytes Relative: 6.8 %
Neutro Abs: 3443 cells/uL (ref 1500–7800)
Neutrophils Relative %: 67.5 %
Platelets: 252 10*3/uL (ref 140–400)
RBC: 4.04 10*6/uL (ref 3.80–5.10)
RDW: 12.6 % (ref 11.0–15.0)
Total Lymphocyte: 24.3 %
WBC: 5.1 10*3/uL (ref 3.8–10.8)

## 2022-07-13 LAB — COMPLETE METABOLIC PANEL WITH GFR
AG Ratio: 1.5 (calc) (ref 1.0–2.5)
ALT: 21 U/L (ref 6–29)
AST: 27 U/L (ref 10–35)
Albumin: 4.3 g/dL (ref 3.6–5.1)
Alkaline phosphatase (APISO): 66 U/L (ref 37–153)
BUN: 11 mg/dL (ref 7–25)
CO2: 31 mmol/L (ref 20–32)
Calcium: 9.5 mg/dL (ref 8.6–10.4)
Chloride: 104 mmol/L (ref 98–110)
Creat: 0.86 mg/dL (ref 0.60–1.00)
Globulin: 2.9 g/dL (calc) (ref 1.9–3.7)
Glucose, Bld: 91 mg/dL (ref 65–99)
Potassium: 4.2 mmol/L (ref 3.5–5.3)
Sodium: 141 mmol/L (ref 135–146)
Total Bilirubin: 1.2 mg/dL (ref 0.2–1.2)
Total Protein: 7.2 g/dL (ref 6.1–8.1)
eGFR: 71 mL/min/{1.73_m2} (ref >=60)

## 2022-07-13 LAB — LIPASE: Lipase: 14 U/L (ref 7–60)

## 2022-07-13 LAB — SEDIMENTATION RATE: Sed Rate: 9 mm/h (ref 0–30)

## 2022-07-13 LAB — C-REACTIVE PROTEIN: CRP: 0.5 mg/L (ref <8.0)

## 2022-07-13 LAB — TSH: TSH: 3.9 mIU/L (ref 0.40–4.50)

## 2022-07-18 LAB — GI PATHOGEN PANEL BY PCR, STOOL
Adenovirus F40/41: NEGATIVE
Astrovirus: NEGATIVE
C.DIFFICILE TOXIN: NEGATIVE
CRYPTOSPORIDIUM SPECIES: NEGATIVE
Campylobacter species: NEGATIVE
Cyclospora cayetanensis: NEGATIVE
ENTEROAGGREGATIVE E. COLI (EAEC): NEGATIVE
ENTEROPATHOGENIC E. COLI (EPEC): NEGATIVE
ENTEROTOXIGENIC E. COLI (ETEC): NEGATIVE
Entamoeba histolytica: NEGATIVE
GIARDIA: NEGATIVE
Norovirus GI/GII: NEGATIVE
Plesiomonas shigelloides: NEGATIVE
ROTAVIRUS: NEGATIVE
SHIGA TOXIN PRODUCING E. COLI: NEGATIVE
SHIGELLA/ENTEROINVASIVE E. COLI: NEGATIVE
Salmonella species: NEGATIVE
Sapovirus: NEGATIVE
Vibrio cholerae: NEGATIVE
Vibrio species: NEGATIVE
YERSINIA SPECIES: NEGATIVE

## 2022-08-01 ENCOUNTER — Ambulatory Visit: Payer: Self-pay

## 2022-08-01 NOTE — Chronic Care Management (AMB) (Signed)
   08/01/2022  Lauren Eaton 1946-04-04 [id number]  Documentation encounter created to complete case transition. The care management team will follow for care coordination as needed.   [facility] Care Management [phone]

## 2022-09-16 ENCOUNTER — Ambulatory Visit
Admission: RE | Admit: 2022-09-16 | Discharge: 2022-09-16 | Disposition: A | Discharge status: Home / Self Care | Payer: Medicare Other | Source: Ambulatory Visit | Attending: Family Medicine | Admitting: Family Medicine

## 2022-09-16 ENCOUNTER — Other Ambulatory Visit: Payer: Self-pay

## 2022-09-16 DIAGNOSIS — Z1231 Encounter for screening mammogram for malignant neoplasm of breast: Secondary | ICD-10-CM | POA: Insufficient documentation

## 2022-09-16 DIAGNOSIS — M810 Age-related osteoporosis without current pathological fracture: Secondary | ICD-10-CM | POA: Diagnosis not present

## 2022-09-16 NOTE — Progress Notes (Signed)
Patient notified, Endo ref placed.

## 2022-10-24 ENCOUNTER — Ambulatory Visit: Payer: Medicare Other | Admitting: Family Medicine

## 2022-10-29 ENCOUNTER — Other Ambulatory Visit: Payer: Self-pay | Admitting: Family Medicine

## 2022-10-29 DIAGNOSIS — E785 Hyperlipidemia, unspecified: Secondary | ICD-10-CM

## 2022-11-14 NOTE — Progress Notes (Signed)
Name: Lauren Eaton   MRN: [id number]    DOB: [birth year]   Date:11/15/2022       Progress Note  Subjective  Chief Complaint  Follow Up  HPI  Osteoporosis: seen by Dr. Jefm Bryant n August 2016, and was started on Fosamax, she had a gap, but compliant again .She took almost two years of Forteo back in 2012 and 2013.Bone density done  03/2015  still showed osteoporosis. She had a repeat bone density done 10/25/2019 and stable. She resumed Fosamax Fall 2020 , tolerating medication well, taking vitamin D supplementation last vitamin D level was at goal , last bone density done 09/2022 showed no progression of osteoporosis but also no improvement, discussed referral to Endo - daughter would like to hold off on that for now    Hyperlipidemia: on statin therapy,  she denies side effects of medication. 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 Lives alone. Marland Kitchen MMS initially was  29 she has been taking Aricept 10 mg and denies side effects  . Positive family history of Alzheimer's - mother, also has a sister with symptoms but not formally diagnosed with dementia. She was getting very frustrated with previous neighbor and was calling wicked, she moved out of the apartment complex, she initially still complained that  previous neighbors were bothering her  " they have devices and area sill inside of her house" , daughter states symptoms are stable, still gets forgetful but not any worse than usual, able to pay her own bills, has a social life with friends and still goes out to eat, she also still cooks on her own. She had the flu shot before I walked in and had forgotten about it   Diarrhea: patient started having diarrhea since Spring  . She had lost 15 lbs, only gained 3 lbs since last visit ,baseline weight is between 128-130 lbs. She states appetite is  normal, no abdominal pain or blood in stools. We checked multiple labs on her last visit and all normal. She takes some otc supplements and daughter states symptoms started around the same time, advised to stop taking otc supplementations , except for calcium and vitamin D and advised referral to gastroenterologist   Hypothyroidism: taking levothyroxine daily, weight is down and has diarrhea, no palpitation or anxiety symptoms . Last TSH  Patient Active Problem List   Diagnosis Date Noted   Hypothyroidism, adult 07/12/2022   Behavioral change 08/09/2016   Memory changes 08/09/2016   Coccyalgia 07/29/2015   Abnormal thyroid function test 07/20/2010   Dyslipidemia 07/20/2010   OP (osteoporosis) 07/26/2009   History of anemia 07/20/2009    Past Surgical History:  Procedure Laterality Date   BREAST BIOPSY Right 11/16/2019   rt mass stereo bx/ neg/ x clip   SURGERY OF LIP      Family History  Problem Relation Age of Onset   Alzheimer's disease Mother    Dementia Mother    Early death Father        MVA   CVA Sister 75       complications of   Cancer Brother        stomach   Cancer Brother        lung   Breast cancer Neg Hx     Social History   Tobacco Use   Smoking  status: Never   Smokeless tobacco: Former    Types: Snuff   Tobacco comments:    Used snuff, dips 3-4 dips/day, has used it 40-50 years. quit 10/2014.  Substance Use Topics   Alcohol use: No    Alcohol/week: 0.0 standard drinks of alcohol     Current Outpatient Medications:    alendronate (FOSAMAX) 70 MG tablet, TAKE 1 TABLET(70 MG) BY MOUTH 1 TIME A WEEK WITH A FULL GLASS OF WATER AND ON AN EMPTY STOMACH, Disp: 12 tablet, Rfl: 5   aspirin EC 81 MG tablet, Take 1 tablet (81 mg total) by mouth daily., Disp: 30 tablet, Rfl: 0   atorvastatin (LIPITOR) 40 MG tablet, TAKE 1 TABLET(40 MG) BY MOUTH DAILY, Disp: 90 tablet, Rfl: 0   calcium-vitamin D (OSCAL-500) 500-400 MG-UNIT tablet, Take 1 tablet by mouth  daily., Disp: , Rfl:    donepezil (ARICEPT) 10 MG tablet, TAKE 1 TABLET(10 MG) BY MOUTH AT BEDTIME, Disp: 90 tablet, Rfl: 0   hydrocortisone 1 % ointment, Apply 1 application topically 2 (two) times daily., Disp: 30 g, Rfl: 0   levothyroxine (SYNTHROID) 25 MCG tablet, TAKE 1 TABLET(25 MCG) BY MOUTH DAILY BEFORE AND BREAKFAST, Disp: 90 tablet, Rfl: 0   lidocaine (LIDODERM) 5 %, Place 2 patches onto the skin daily. Remove & Discard patch within 12 hours or as directed by MD, Disp: 180 patch, Rfl: 1   loratadine (CLARITIN) 10 MG tablet, Take 1 tablet (10 mg total) by mouth daily., Disp: 30 tablet, Rfl: 11   Multiple Vitamin (MULTI-VITAMINS) TABS, Take 1 tablet by mouth daily., Disp: , Rfl:   No Known Allergies  I personally reviewed active problem list, medication list, allergies, family history, social history, health maintenance with the patient/caregiver today.   ROS  Constitutional: Negative for fever or weight change.  Respiratory: Negative for cough and shortness of breath.   Cardiovascular: Negative for chest pain or palpitations.  Gastrointestinal: Negative for abdominal pain, no bowel changes.  Musculoskeletal: Negative for gait problem or joint swelling.  Skin: Negative for rash.  Neurological: Negative for dizziness or headache.  No other specific complaints in a complete review of systems (except as listed in HPI above).   Objective  Vitals:   11/15/22 0740  BP: 134/72  Pulse: 77  Resp: 16  SpO2: 100%  Weight: 118 lb (53.5 kg)  Height: 5\' 4"  (1.626 m)    Body mass index is 20.25 kg/m.  Physical Exam  Constitutional: Patient appears well-developed and malnourished  No distress.  HEENT: head atraumatic, normocephalic, pupils equal and reactive to light, neck supple Cardiovascular: Normal rate, regular rhythm and normal heart sounds.  No murmur heard. No BLE edema. Pulmonary/Chest: Effort normal and breath sounds normal. No respiratory distress. Abdominal: Soft.   There is no tenderness.normal bowel sounds  Psychiatric: Patient has a normal mood and affect. behavior is normal. Judgment and thought content normal.    PHQ2/9:    11/15/2022    7:39 AM 07/12/2022    9:18 AM 03/14/2022   11:47 AM 12/07/2021   10:01 AM 11/27/2021    3:10 PM  Depression screen PHQ 2/9  Decreased Interest 0 0 0 0 0  Down, Depressed, Hopeless 0 0 0 0 0  PHQ - 2 Score 0 0 0 0 0  Altered sleeping 0 0  0 0  Tired, decreased energy 0 0  0 0  Change in appetite 1 0  0 0  Feeling bad or failure about yourself  0  0  0 0  Trouble concentrating 0 0  0 0  Moving slowly or fidgety/restless 0 0  0 0  Suicidal thoughts 0 0  0 0  PHQ-9 Score 1 0  0 0  Difficult doing work/chores  Not difficult at all   Not difficult at all    phq 9 is negative   Fall Risk:    11/15/2022    7:39 AM 07/12/2022    9:18 AM 03/14/2022   11:48 AM 12/19/2021    9:58 AM 12/07/2021   10:01 AM  [place] in the past year? 0 0 0 0 0  Number falls in past yr: 0 0 0 0 0  Injury with Fall? 0 0 0 0 0  Risk for fall due to : No Fall Risks No Fall Risks No Fall Risks  No Fall Risks  Follow up Falls prevention discussed Falls prevention discussed;Education provided Falls prevention discussed Falls prevention discussed Falls prevention discussed      Functional Status Survey: Is the patient deaf or have difficulty hearing?: No Does the patient have difficulty seeing, even when wearing glasses/contacts?: No Does the patient have difficulty concentrating, remembering, or making decisions?: Yes Does the patient have difficulty walking or climbing stairs?: No Does the patient have difficulty dressing or bathing?: No Does the patient have difficulty doing errands alone such as visiting a doctor's office or shopping?: No    Assessment & Plan  1. Need for immunization against influenza  - Flu Vaccine QUAD High Dose(Fluad)  2. Recent change in frequency of bowel movements  - Ambulatory referral  to Gastroenterology  3. Unintentional weight loss  - Ambulatory referral to Gastroenterology  4. Age-related osteoporosis without current pathological fracture  - alendronate (FOSAMAX) 70 MG tablet; TAKE 1 TABLET(70 MG) BY MOUTH 1 TIME A WEEK WITH A FULL GLASS OF WATER AND ON AN EMPTY STOMACH  Dispense: 12 tablet; Refill: 2  5. Dyslipidemia  - atorvastatin (LIPITOR) 40 MG tablet; TAKE 1 TABLET(40 MG) BY MOUTH DAILY  Dispense: 90 tablet; Refill: 1  6. Mild cognitive impairment  - donepezil (ARICEPT) 10 MG tablet; Take 1 tablet (10 mg total) by mouth daily at 12 noon.  Dispense: 90 tablet; Refill: 1  7. Hypothyroidism, adult  - levothyroxine (SYNTHROID) 25 MCG tablet; TAKE 1 TABLET(25 MCG) BY MOUTH DAILY BEFORE AND BREAKFAST  Dispense: 90 tablet; Refill: 1   8. Mild protein-calorie malnutrition ([place])  Advised to increase protein and calorie intake

## 2022-11-15 ENCOUNTER — Encounter: Payer: Self-pay | Admitting: Family Medicine

## 2022-11-15 ENCOUNTER — Ambulatory Visit (INDEPENDENT_AMBULATORY_CARE_PROVIDER_SITE_OTHER): Payer: Medicare Other | Admitting: Family Medicine

## 2022-11-15 VITALS — BP 134/72 | HR 77 | Resp 16 | Ht 64.0 in | Wt 118.0 lb

## 2022-11-15 DIAGNOSIS — E039 Hypothyroidism, unspecified: Secondary | ICD-10-CM

## 2022-11-15 DIAGNOSIS — E785 Hyperlipidemia, unspecified: Secondary | ICD-10-CM

## 2022-11-15 DIAGNOSIS — R634 Abnormal weight loss: Secondary | ICD-10-CM

## 2022-11-15 DIAGNOSIS — M810 Age-related osteoporosis without current pathological fracture: Secondary | ICD-10-CM | POA: Diagnosis not present

## 2022-11-15 DIAGNOSIS — R194 Change in bowel habit: Secondary | ICD-10-CM | POA: Diagnosis not present

## 2022-11-15 DIAGNOSIS — E441 Mild protein-calorie malnutrition: Secondary | ICD-10-CM | POA: Diagnosis not present

## 2022-11-15 DIAGNOSIS — Z23 Encounter for immunization: Secondary | ICD-10-CM

## 2022-11-15 DIAGNOSIS — G3184 Mild cognitive impairment, so stated: Secondary | ICD-10-CM

## 2022-11-15 MED ORDER — ALENDRONATE SODIUM 70 MG PO TABS: ORAL_TABLET | ORAL | 2 refills | Status: DC

## 2022-11-15 MED ORDER — LEVOTHYROXINE SODIUM 25 MCG PO TABS: ORAL_TABLET | ORAL | 1 refills | Status: DC

## 2022-11-15 MED ORDER — ATORVASTATIN CALCIUM 40 MG PO TABS: ORAL_TABLET | ORAL | 1 refills | Status: DC

## 2022-11-15 MED ORDER — DONEPEZIL HCL 10 MG PO TABS: 10.0000 mg | ORAL_TABLET | Freq: Every day | ORAL | 1 refills | Status: DC

## 2022-11-15 NOTE — Patient Instructions (Signed)
Due for RSV and also Tdap

## 2023-01-27 ENCOUNTER — Ambulatory Visit (INDEPENDENT_AMBULATORY_CARE_PROVIDER_SITE_OTHER): Payer: 59 | Admitting: Podiatry

## 2023-01-27 ENCOUNTER — Encounter: Payer: Self-pay | Admitting: Podiatry

## 2023-01-27 DIAGNOSIS — L600 Ingrowing nail: Secondary | ICD-10-CM | POA: Diagnosis not present

## 2023-01-27 MED ORDER — NEOMYCIN-POLYMYXIN-HC 1 % OT SOLN: OTIC | 1 refills | Status: DC

## 2023-01-27 NOTE — Progress Notes (Signed)
Subjective:  Patient ID: Lauren Eaton, female    DOB: 1946-05-06,  MRN: [id number] HPI Chief Complaint  Patient presents with   Toe Pain    Hallux left - medial border is tender, thinks she has an ingrown toenail   New Patient (Initial Visit)    Est pt 2021    77 y.o. female presents with the above complaint.   ROS: Denies fever chills nausea vomiting muscle aches pains calf pain back pain chest pain shortness of breath.  Past Medical History:  Diagnosis Date   Abnormal finding on thyroid function test    recheck labs    Abnormal glucose    Chronic insomnia    Dyslipidemia    onset 07/20/10   History of anemia    Hyperlipidemia    Mild cognitive impairment    Osteoporosis    she took forteo in 2012, but has been off medication, bone density is still showing osteoporosis, refer to rheumatologist   Vitamin D deficiency    Past Surgical History:  Procedure Laterality Date   BREAST BIOPSY Right 11/16/2019   rt mass stereo bx/ neg/ x clip   SURGERY OF LIP      Current Outpatient Medications:    NEOMYCIN-POLYMYXIN-HYDROCORTISONE (CORTISPORIN) 1 % SOLN OTIC solution, Apply 1-2 drops to toe BID after soaking, Disp: 10 mL, Rfl: 1   alendronate (FOSAMAX) 70 MG tablet, TAKE 1 TABLET(70 MG) BY MOUTH 1 TIME A WEEK WITH A FULL GLASS OF WATER AND ON AN EMPTY STOMACH, Disp: 12 tablet, Rfl: 2   aspirin EC 81 MG tablet, Take 1 tablet (81 mg total) by mouth daily., Disp: 30 tablet, Rfl: 0   atorvastatin (LIPITOR) 40 MG tablet, TAKE 1 TABLET(40 MG) BY MOUTH DAILY, Disp: 90 tablet, Rfl: 1   calcium-vitamin D (OSCAL-500) 500-400 MG-UNIT tablet, Take 1 tablet by mouth daily., Disp: , Rfl:    donepezil (ARICEPT) 10 MG tablet, Take 1 tablet (10 mg total) by mouth daily at 12 noon., Disp: 90 tablet, Rfl: 1   hydrocortisone 1 % ointment, Apply 1 application topically 2 (two) times daily., Disp: 30 g, Rfl: 0   levothyroxine (SYNTHROID) 25 MCG tablet, TAKE 1 TABLET(25 MCG) BY MOUTH DAILY BEFORE AND  BREAKFAST, Disp: 90 tablet, Rfl: 1   lidocaine (LIDODERM) 5 %, Place 2 patches onto the skin daily. Remove & Discard patch within 12 hours or as directed by MD, Disp: 180 patch, Rfl: 1   loratadine (CLARITIN) 10 MG tablet, Take 1 tablet (10 mg total) by mouth daily., Disp: 30 tablet, Rfl: 11   Multiple Vitamin (MULTI-VITAMINS) TABS, Take 1 tablet by mouth daily., Disp: , Rfl:   No Known Allergies Review of Systems Objective:  There were no vitals filed for this visit.  General: Well developed, nourished, in no acute distress, alert and oriented x3   Dermatological: Skin is warm, dry and supple bilateral. Nails x 10 are well maintained; remaining integument appears unremarkable at this time. There are no open sores, no preulcerative lesions, no rash or signs of infection present.  Ingrown toenail tibial border hallux left mild erythema Sharp incurvated nail margin.  No purulence no malodor no cellulitic process  Vascular: Dorsalis Pedis artery and Posterior Tibial artery pedal pulses are 2/4 bilateral with immedate capillary fill time. Pedal hair growth present. No varicosities and no lower extremity edema present bilateral.   Neruologic: Grossly intact via light touch bilateral. Vibratory intact via tuning fork bilateral. Protective threshold with Semmes Wienstein monofilament intact to all  pedal sites bilateral. Patellar and Achilles deep tendon reflexes 2+ bilateral. No Babinski or clonus noted bilateral.   Musculoskeletal: No gross boney pedal deformities bilateral. No pain, crepitus, or limitation noted with foot and ankle range of motion bilateral. Muscular strength 5/5 in all groups tested bilateral.  Gait: Unassisted, Nonantalgic.    Radiographs:  None taken  Assessment & Plan:   Assessment: Ingrown toenail tibial border hallux left  Plan: Chemical matricectomy was performed today tolerated procedure well without complications.  Was given both oral written home-going  instructions of care and soaking of the toe.  She was also provided with a prescription for Cortisporin Otic to be applied twice daily after soaking.  Like to follow-up with her in 2 to 3 weeks.     Mattheus Rauls T. [place], [place]

## 2023-01-27 NOTE — Patient Instructions (Signed)

## 2023-02-19 ENCOUNTER — Ambulatory Visit: Payer: 59 | Admitting: Podiatry

## 2023-03-18 ENCOUNTER — Ambulatory Visit: Payer: 59 | Admitting: Family Medicine

## 2023-03-20 ENCOUNTER — Ambulatory Visit (INDEPENDENT_AMBULATORY_CARE_PROVIDER_SITE_OTHER): Payer: 59

## 2023-03-20 VITALS — Ht 64.0 in | Wt 118.0 lb

## 2023-03-20 DIAGNOSIS — Z0000 Encounter for general adult medical examination without abnormal findings: Secondary | ICD-10-CM

## 2023-03-20 NOTE — Progress Notes (Signed)
I connected with  Sanjna Rouch on 03/20/23 by a audio enabled telemedicine application and verified that I am speaking with the correct person using two identifiers.  Patient Location: Home  Provider Location: Office/Clinic  I discussed the limitations of evaluation and management by telemedicine. The patient expressed understanding and agreed to proceed.  Subjective:   Lauren Eaton is a 77 y.o. female who presents for Medicare Annual (Subsequent) preventive examination.  Review of Systems    Cardiac Risk Factors include: advanced age (>36men, >46 women);dyslipidemia    Objective:    Today's Vitals   03/20/23 1133  Weight: 118 lb (53.5 kg)  Height: 5\' 4"  (1.626 m)   Body mass index is 20.25 kg/m.     03/20/2023   11:43 AM 03/14/2022   11:48 AM 10/24/2020   10:29 AM 10/21/2019   10:13 AM 06/19/2017    8:44 AM 04/01/2017    9:20 PM 01/27/2017    2:53 PM  Advanced Directives  Does Patient Have a Medical Advance Directive? No No No No No No No  Would patient like information on creating a medical advance directive?  No - Patient declined Yes (MAU/Ambulatory/Procedural Areas - Information given) Yes (MAU/Ambulatory/Procedural Areas - Information given)  No - Patient declined     Current Medications (verified) Outpatient Encounter Medications as of 03/20/2023  Medication Sig   alendronate (FOSAMAX) 70 MG tablet TAKE 1 TABLET(70 MG) BY MOUTH 1 TIME A WEEK WITH A FULL GLASS OF WATER AND ON AN EMPTY STOMACH   aspirin EC 81 MG tablet Take 1 tablet (81 mg total) by mouth daily.   atorvastatin (LIPITOR) 40 MG tablet TAKE 1 TABLET(40 MG) BY MOUTH DAILY   calcium-vitamin D (OSCAL-500) 500-400 MG-UNIT tablet Take 1 tablet by mouth daily.   donepezil (ARICEPT) 10 MG tablet Take 1 tablet (10 mg total) by mouth daily at 12 noon.   hydrocortisone 1 % ointment Apply 1 application topically 2 (two) times daily.   levothyroxine (SYNTHROID) 25 MCG tablet TAKE 1 TABLET(25 MCG) BY MOUTH DAILY BEFORE  AND BREAKFAST   lidocaine (LIDODERM) 5 % Place 2 patches onto the skin daily. Remove & Discard patch within 12 hours or as directed by MD   loratadine (CLARITIN) 10 MG tablet Take 1 tablet (10 mg total) by mouth daily.   Multiple Vitamin (MULTI-VITAMINS) TABS Take 1 tablet by mouth daily.   NEOMYCIN-POLYMYXIN-HYDROCORTISONE (CORTISPORIN) 1 % SOLN OTIC solution Apply 1-2 drops to toe BID after soaking   No facility-administered encounter medications on file as of 03/20/2023.    Allergies (verified) Patient has no known allergies.   History: Past Medical History:  Diagnosis Date   Abnormal finding on thyroid function test    recheck labs    Abnormal glucose    Chronic insomnia    Dyslipidemia    onset 07/20/10   History of anemia    Hyperlipidemia    Mild cognitive impairment    Osteoporosis    she took forteo in 2012, but has been off medication, bone density is still showing osteoporosis, refer to rheumatologist   Vitamin D deficiency    Past Surgical History:  Procedure Laterality Date   BREAST BIOPSY Right 11/16/2019   rt mass stereo bx/ neg/ x clip   SURGERY OF LIP     Family History  Problem Relation Age of Onset   Alzheimer's disease Mother    Dementia Mother    Early death Father        MVA  CVA Sister 50       complications of   Cancer Brother        stomach   Cancer Brother        lung   Breast cancer Neg Hx    Social History   Socioeconomic History   Marital status: Single    Spouse name: Not on file   Number of children: 1   Years of education: 4   Highest education level: Not on file  Occupational History   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 quit 10/2014.  Vaping Use   Vaping Use: Never used  Substance and Sexual Activity   Alcohol use:  No    Alcohol/week: 0.0 standard drinks of alcohol   Drug use: No   Sexual activity: Not Currently  Other Topics Concern   Not on file  Social History Narrative   Lives alone in a senior citizen apartment, has friends and likes to take care of her plants and go out with her friend/neighbors.    Still able to drive - but only grocery stores and church.    Currently watching her great-grandson - infant   Caffeine use: Soda daily   Right handed   Social Determinants of Health   Financial Resource Strain: Low Risk  (03/20/2023)   Overall Financial Resource Strain (CARDIA)    Difficulty of Paying Living Expenses: Not hard at all  Food Insecurity: No Food Insecurity (03/20/2023)   Hunger Vital Sign    Worried About Running Out of Food in the Last Year: Never true    Ran Out of Food in the Last Year: Never true  Transportation Needs: No Transportation Needs (03/20/2023)   PRAPARE - Hydrologist (Medical): No    Lack of Transportation (Non-Medical): No  Physical Activity: Sufficiently Active (03/20/2023)   Exercise Vital Sign    Days of Exercise per Week: 5 days    Minutes of Exercise per Session: 50 min  Stress: No Stress Concern Present (03/20/2023)   [place]    Feeling of Stress : Not at all  Social Connections: Moderately Isolated (03/20/2023)   Social Connection and Isolation Panel [NHANES]    Frequency of Communication with Friends and Family: More than three times a week    Frequency of Social Gatherings with Friends and Family: More than three times a week    Attends Religious Services: More than 4 times per year    Active Member of [employer] or Organizations: No    Attends Archivist Meetings: Never    Marital Status: Widowed    Tobacco Counseling Counseling given: Not Answered Tobacco comments: Used snuff, dips 3-4 dips/day, has used it 40-50 years. quit 10/2014.   Clinical  Intake:  Pre-visit preparation completed: Yes  Pain : No/denies pain   BMI - recorded: 20.25 Nutritional Status: BMI of 19-24  Normal Nutritional Risks: None Diabetes: No  How often do you need to have someone help you when you read instructions, pamphlets, or other written materials from your doctor or pharmacy?: 1 - Never  Diabetic?no  Interpreter Needed?: No  Comments: [facility] [place] entered by :: B.Carlis Burnsworth,LPN   Activities of  Daily Living    03/20/2023   11:43 AM 11/15/2022    7:39 AM  In your present state of health, do you have any difficulty performing the following activities:  Hearing? 0 0  Vision? 0 0  Difficulty concentrating or making decisions? 1 1  Walking or climbing stairs? 0 0  Dressing or bathing? 0 0  Doing errands, shopping? 0 0  Preparing Food and eating ? N   Using the Toilet? N   In the past six months, have you accidently leaked urine? N   Do you have problems with loss of bowel control? N   Managing your Medications? N   Managing your Finances? N   Housekeeping or managing your Housekeeping? N     Patient Care Team: Steele Sizer, MD as PCP - General (Family Medicine)  Indicate any recent Medical Services you may have received from other than [facility] providers in the past year (date may be approximate).     Assessment:   This is a routine wellness examination for Lauren Eaton.  Hearing/Vision screen Hearing Screening - Comments:: Adequate hearing Vision Screening - Comments:: Adequate vision w/glasses [facility]  Dietary issues and exercise activities discussed: Current Exercise Habits: Home exercise routine, Type of exercise: walking, Time (Minutes): 30, Frequency (Times/Week): 5, Weekly Exercise (Minutes/Week): 150, Intensity: Mild, Exercise limited by: None identified   Goals Addressed   None    Depression Screen    03/20/2023   11:37 AM 11/15/2022    7:39 AM 07/12/2022    9:18 AM 03/14/2022   11:47 AM 12/07/2021    10:01 AM 11/27/2021    3:10 PM 11/21/2021   10:28 AM  PHQ 2/9 Scores  PHQ - 2 Score 0 0 0 0 0 0 0  PHQ- 9 Score  1 0  0 0     Fall Risk    03/20/2023   11:35 AM 11/15/2022    7:39 AM 07/12/2022    9:18 AM 03/14/2022   11:48 AM 12/19/2021    9:58 AM  Fall Risk   Falls in the past year? 0 0 0 0 0  Number falls in past yr: 0 0 0 0 0  Injury with Fall? 0 0 0 0 0  Risk for fall due to : No Fall Risks No Fall Risks No Fall Risks No Fall Risks   Follow up Education provided;Falls prevention discussed Falls prevention discussed Falls prevention discussed;Education provided Falls prevention discussed Falls prevention discussed    FALL RISK PREVENTION PERTAINING TO THE HOME:  Any stairs in or around the home? Yes  If so, are there any without handrails? Yes  Home free of loose throw rugs in walkways, pet beds, electrical cords, etc? Yes  Adequate lighting in your home to reduce risk of falls? Yes   ASSISTIVE DEVICES UTILIZED TO PREVENT FALLS:  Life alert? No  Use of a cane, walker or w/c? No  Grab bars in the bathroom? yes Shower chair or bench in shower? Yes  Elevated toilet seat or a handicapped toilet? No    Cognitive Function:    04/27/2018    3:16 PM 02/16/2018    3:55 PM 08/09/2016   10:04 AM 08/09/2016   10:01 AM  MMSE - Mini Mental State Exam  Orientation to time 3 4  5   Orientation to Place 3 5  5   Registration 3 3  3   Attention/ Calculation 2 5  4   Recall 2 3  3   Language- name 2 objects 2  2  2  Language- repeat 1 1  1   Language- follow 3 step command 3 3  3   Language- read & follow direction 1 1  1   Write a sentence 1 1  1   Copy design 1 1 0   Copy design-comments    0  Total score 22 29          03/20/2023   11:45 AM 10/24/2020   10:30 AM 10/21/2019   10:16 AM  6CIT Screen  What Year? 0 points 0 points 0 points  What month? 0 points 0 points 0 points  What time? 0 points 0 points 0 points  Count back from 20 4 points 0 points 0 points  Months in reverse 4  points 4 points 4 points  Repeat phrase 10 points 2 points 4 points  Total Score 18 points 6 points 8 points    Immunizations Immunization History  Administered Date(s) Administered   Fluad Quad(high Dose 65+) 08/11/2019, 10/20/2020, 11/27/2021, 11/15/2022   Influenza, High Dose Seasonal PF 08/11/2017, 09/22/2018   Influenza,inj,Quad PF,6+ Mos 07/31/2015   Influenza-Unspecified 11/11/2016   Moderna Sars-Covid-2 Vaccination 02/03/2020, 03/02/2020, 11/07/2020, 01/01/2021, 04/03/2021   Pneumococcal Conjugate (Pcv15) 10/06/2014   Pneumococcal Conjugate-13 01/27/2017   Pneumococcal Polysaccharide-23 07/31/2015   Td 07/26/2009   Zoster Recombinat (Shingrix) 08/21/2017, 10/25/2017   Zoster, Live 07/31/2015    TDAP status: Up to date  Flu Vaccine status: Up to date  Pneumococcal vaccine status: Up to date  Covid-19 vaccine status: Completed vaccines  Qualifies for Shingles Vaccine? Yes   Zostavax completed Yes   Shingrix Completed?: Yes  Screening Tests Health Maintenance  Topic Date Due   DTaP/Tdap/Td (2 - Tdap) 07/27/2019   COLONOSCOPY (Pts 45-71yrs Insurance coverage will need to be confirmed)  08/17/2019   COVID-19 Vaccine (6 - 2023-24 season) 08/16/2022   INFLUENZA VACCINE  07/17/2023   MAMMOGRAM  09/17/2023   Medicare Annual Wellness (AWV)  03/19/2024   Pneumonia Vaccine 34+ Years old  Completed   DEXA SCAN  Completed   Hepatitis C Screening  Completed   Zoster Vaccines- Shingrix  Completed   HPV VACCINES  Aged Out    Health Maintenance  Health Maintenance Due  Topic Date Due   DTaP/Tdap/Td (2 - Tdap) 07/27/2019   COLONOSCOPY (Pts 45-39yrs Insurance coverage will need to be confirmed)  08/17/2019   COVID-19 Vaccine (6 - 2023-24 season) 08/16/2022    Colorectal cancer screening: No longer required.   Mammogram status: No longer required due to age.  Bone Density status: Completed yes. Results reflect: Bone density results: OSTEOPOROSIS. Repeat every 5  years.  Lung Cancer Screening: (Low Dose CT Chest recommended if Age 41-80 years, 30 pack-year currently smoking OR have quit w/in 15years.) does not qualify.   Lung Cancer Screening Referral: no  Additional Screening:  Hepatitis C Screening: does not qualify; Completed yes  Vision Screening: Recommended annual ophthalmology exams for early detection of glaucoma and other disorders of the eye. Is the patient up to date with their annual eye exam?  Yes  Who is the provider or what is the name of the office in which the patient attends annual eye exams? [facility] If pt is not established with a provider, would they like to be referred to a provider to establish care? No .   Dental Screening: Recommended annual dental exams for proper oral hygiene  Community Resource Referral / Chronic Care Management: CRR required this visit?  No   CCM required this  visit?  No    Plan:     I have personally reviewed and noted the following in the patient's chart:   Medical and social history Use of alcohol, tobacco or illicit drugs  Current medications and supplements including opioid prescriptions. Patient is not currently taking opioid prescriptions. Functional ability and status Nutritional status Physical activity Advanced directives List of other physicians Hospitalizations, surgeries, and ER visits in previous 12 months Vitals Screenings to include cognitive, depression, and falls Referrals and appointments  In addition, I have reviewed and discussed with patient certain preventive protocols, quality metrics, and best practice recommendations. A written personalized care plan for preventive services as well as general preventive health recommendations were provided to patient.    Roger Shelter, LPN   [id number]   Nurse Notes: The patient states they are doing well and has no concerns or questions at this time.

## 2023-03-20 NOTE — Patient Instructions (Signed)
[name] , Thank you for taking time to come for your Medicare Wellness Visit. I appreciate your ongoing commitment to your health goals. Please review the following plan we discussed and let me know if I can assist you in the future.   These are the goals we discussed:  Goals      Patient Stated     Remain healthy and active        This is a list of the screening recommended for you and due dates:  Health Maintenance  Topic Date Due   DTaP/Tdap/Td vaccine (2 - Tdap) 07/27/2019   Colon Cancer Screening  08/17/2019   COVID-19 Vaccine (6 - 2023-24 season) 08/16/2022   Flu Shot  07/17/2023   Mammogram  09/17/2023   Medicare Annual Wellness Visit  03/19/2024   Pneumonia Vaccine  Completed   DEXA scan (bone density measurement)  Completed   Hepatitis C Screening: USPSTF Recommendation to screen - Ages 18-79 yo.  Completed   Zoster (Shingles) Vaccine  Completed   HPV Vaccine  Aged Out    Advanced directives: no;will mAIL per request  Conditions/risks identified: yes  Next appointment: Follow up in one year for your annual wellness visit 03/25/2024 @ 11am telephone   Preventive Care 65 Years and Older, Female Preventive care refers to lifestyle choices and visits with your health care provider that can promote health and wellness. What does preventive care include? A yearly physical exam. This is also called an annual well check. Dental exams once or twice a year. Routine eye exams. Ask your health care provider how often you should have your eyes checked. Personal lifestyle choices, including: Daily care of your teeth and gums. Regular physical activity. Eating a healthy diet. Avoiding tobacco and drug use. Limiting alcohol use. Practicing safe sex. Taking low-dose aspirin every day. Taking vitamin and mineral supplements as recommended by your health care provider. What happens during an annual well check? The services and screenings done by your health care provider  during your annual well check will depend on your age, overall health, lifestyle risk factors, and family history of disease. Counseling  Your health care provider may ask you questions about your: Alcohol use. Tobacco use. Drug use. Emotional well-being. Home and relationship well-being. Sexual activity. Eating habits. History of falls. Memory and ability to understand (cognition). Work and work Statistician. Reproductive health. Screening  You may have the following tests or measurements: Height, weight, and BMI. Blood pressure. Lipid and cholesterol levels. These may be checked every 5 years, or more frequently if you are over 12 years old. Skin check. Lung cancer screening. You may have this screening every year starting at age 31 if you have a 30-pack-year history of smoking and currently smoke or have quit within the past 15 years. Fecal occult blood test (FOBT) of the stool. You may have this test every year starting at age 25. Flexible sigmoidoscopy or colonoscopy. You may have a sigmoidoscopy every 5 years or a colonoscopy every 10 years starting at age 71. Hepatitis C blood test. Hepatitis B blood test. Sexually transmitted disease (STD) testing. Diabetes screening. This is done by checking your blood sugar (glucose) after you have not eaten for a while (fasting). You may have this done every 1-3 years. Bone density scan. This is done to screen for osteoporosis. You may have this done starting at age 64. Mammogram. This may be done every 1-2 years. Talk to your health care provider about how often you should have  regular mammograms. Talk with your health care provider about your test results, treatment options, and if necessary, the need for more tests. Vaccines  Your health care provider may recommend certain vaccines, such as: Influenza vaccine. This is recommended every year. Tetanus, diphtheria, and acellular pertussis (Tdap, Td) vaccine. You may need a Td booster every  10 years. Zoster vaccine. You may need this after age 31. Pneumococcal 13-valent conjugate (PCV13) vaccine. One dose is recommended after age 35. Pneumococcal polysaccharide (PPSV23) vaccine. One dose is recommended after age 29. Talk to your health care provider about which screenings and vaccines you need and how often you need them. This information is not intended to replace advice given to you by your health care provider. Make sure you discuss any questions you have with your health care provider. Document Released: 12/29/2015 Document Revised: 08/21/2016 Document Reviewed: 10/03/2015 Elsevier Interactive Patient Education  2017 [place] Prevention in the Home Falls can cause injuries. They can happen to people of all ages. There are many things you can do to make your home safe and to help prevent falls. What can I do on the outside of my home? Regularly fix the edges of walkways and driveways and fix any cracks. Remove anything that might make you trip as you walk through a door, such as a raised step or threshold. Trim any bushes or trees on the path to your home. Use bright outdoor lighting. Clear any walking paths of anything that might make someone trip, such as rocks or tools. Regularly check to see if handrails are loose or broken. Make sure that both sides of any steps have handrails. Any raised decks and porches should have guardrails on the edges. Have any leaves, snow, or ice cleared regularly. Use sand or salt on walking paths during winter. Clean up any spills in your garage right away. This includes oil or grease spills. What can I do in the bathroom? Use night lights. Install grab bars by the toilet and in the tub and shower. Do not use towel bars as grab bars. Use non-skid mats or decals in the tub or shower. If you need to sit down in the shower, use a plastic, non-slip stool. Keep the floor dry. Clean up any water that spills on the floor as soon as it  happens. Remove soap buildup in the tub or shower regularly. Attach bath mats securely with double-sided non-slip rug tape. Do not have throw rugs and other things on the floor that can make you trip. What can I do in the bedroom? Use night lights. Make sure that you have a light by your bed that is easy to reach. Do not use any sheets or blankets that are too big for your bed. They should not hang down onto the floor. Have a firm chair that has side arms. You can use this for support while you get dressed. Do not have throw rugs and other things on the floor that can make you trip. What can I do in the kitchen? Clean up any spills right away. Avoid walking on wet floors. Keep items that you use a lot in easy-to-reach places. If you need to reach something above you, use a strong step stool that has a grab bar. Keep electrical cords out of the way. Do not use floor polish or wax that makes floors slippery. If you must use wax, use non-skid floor wax. Do not have throw rugs and other things on the floor that  can make you trip. What can I do with my stairs? Do not leave any items on the stairs. Make sure that there are handrails on both sides of the stairs and use them. Fix handrails that are broken or loose. Make sure that handrails are as long as the stairways. Check any carpeting to make sure that it is firmly attached to the stairs. Fix any carpet that is loose or worn. Avoid having throw rugs at the top or bottom of the stairs. If you do have throw rugs, attach them to the floor with carpet tape. Make sure that you have a light switch at the top of the stairs and the bottom of the stairs. If you do not have them, ask someone to add them for you. What else can I do to help prevent falls? Wear shoes that: Do not have high heels. Have rubber bottoms. Are comfortable and fit you well. Are closed at the toe. Do not wear sandals. If you use a stepladder: Make sure that it is fully opened.  Do not climb a closed stepladder. Make sure that both sides of the stepladder are locked into place. Ask someone to hold it for you, if possible. Clearly mark and make sure that you can see: Any grab bars or handrails. First and last steps. Where the edge of each step is. Use tools that help you move around (mobility aids) if they are needed. These include: Canes. Walkers. Scooters. Crutches. Turn on the lights when you go into a dark area. Replace any light bulbs as soon as they burn out. Set up your furniture so you have a clear path. Avoid moving your furniture around. If any of your floors are uneven, fix them. If there are any pets around you, be aware of where they are. Review your medicines with your doctor. Some medicines can make you feel dizzy. This can increase your chance of falling. Ask your doctor what other things that you can do to help prevent falls. This information is not intended to replace advice given to you by your health care provider. Make sure you discuss any questions you have with your health care provider. Document Released: 09/28/2009 Document Revised: 05/09/2016 Document Reviewed: 01/06/2015 Elsevier Interactive Patient Education  2017 [employer].

## 2023-04-01 ENCOUNTER — Ambulatory Visit (INDEPENDENT_AMBULATORY_CARE_PROVIDER_SITE_OTHER): Payer: 59 | Admitting: Gastroenterology

## 2023-04-01 ENCOUNTER — Other Ambulatory Visit: Payer: Self-pay

## 2023-04-01 ENCOUNTER — Encounter: Payer: Self-pay | Admitting: Gastroenterology

## 2023-04-01 VITALS — BP 187/89 | HR 67 | Temp 98.5°F | Ht 64.0 in | Wt 119.4 lb

## 2023-04-01 DIAGNOSIS — R194 Change in bowel habit: Secondary | ICD-10-CM

## 2023-04-01 DIAGNOSIS — R634 Abnormal weight loss: Secondary | ICD-10-CM

## 2023-04-01 MED ORDER — NA SULFATE-K SULFATE-MG SULF 17.5-3.13-1.6 GM/177ML PO SOLN: 354.0000 mL | Freq: Once | ORAL | 0 refills | Status: AC

## 2023-04-01 NOTE — Progress Notes (Signed)
[name] MD, MRCP(U.K) [address]  [address]  [place], [place] [address]  Main: [phone]  Fax: [phone]   Gastroenterology Consultation  Referring Provider:     [name], MD Primary Care Physician:  [name], MD Primary Gastroenterologist:  Dr. [name]  Reason for Consultation:     Weight loss,change in bowel habits        HPI:   Lauren Eaton is a 77 y.o. y/o female referred for consultation & management  by Dr. Carlynn Purl, Danna Hefty, MD.    She says that she has lost about 10 to 15 pounds weight over the past 6 months.  She says she is gaining back a part of it.  Denies any difficulty swallowing, abdominal pain, change in bowel habits except having more frequent bowel movements.  Recollects her last colonoscopy was many years back.  No blood in the stool.  No family history of colon cancer.  She consumes a fair amount of sodas and candy.  No other complaints.  07/04/2022 hemoglobin 12.2 g, CMP normal, TSH normal no other labs available after this.  No prior endoscopy procedures available to be viewed on epic. Past Medical History:  Diagnosis Date   Abnormal finding on thyroid function test    recheck labs    Abnormal glucose    Chronic insomnia    Dyslipidemia    onset 07/20/10   History of anemia    Hyperlipidemia    Mild cognitive impairment    Osteoporosis    she took forteo in 2012, but has been off medication, bone density is still showing osteoporosis, refer to rheumatologist   Vitamin D deficiency     Past Surgical History:  Procedure Laterality Date   BREAST BIOPSY Right 11/16/2019   rt mass stereo bx/ neg/ x clip   SURGERY OF LIP      Prior to Admission medications   Medication Sig Start Date End Date Taking? Authorizing Provider  alendronate (FOSAMAX) 70 MG tablet TAKE 1 TABLET(70 MG) BY MOUTH 1 TIME A WEEK WITH A FULL GLASS OF WATER AND ON AN EMPTY STOMACH 11/15/22   [name], MD  aspirin EC 81 MG tablet Take 1 tablet (81  mg total) by mouth daily. 02/13/16   [name], MD  atorvastatin (LIPITOR) 40 MG tablet TAKE 1 TABLET(40 MG) BY MOUTH DAILY 11/15/22   [name], MD  calcium-vitamin D (OSCAL-500) 500-400 MG-UNIT tablet Take 1 tablet by mouth daily.    [provider]  donepezil (ARICEPT) 10 MG tablet Take 1 tablet (10 mg total) by mouth daily at 12 noon. 11/15/22   [name], MD  hydrocortisone 1 % ointment Apply 1 application topically 2 (two) times daily. 11/27/21   Margarita Mail, DO  levothyroxine (SYNTHROID) 25 MCG tablet TAKE 1 TABLET(25 MCG) BY MOUTH DAILY BEFORE AND BREAKFAST 11/15/22   [name], MD  lidocaine (LIDODERM) 5 % Place 2 patches onto the skin daily. Remove & Discard patch within 12 hours or as directed by MD 12/07/21   [name], MD  loratadine (CLARITIN) 10 MG tablet Take 1 tablet (10 mg total) by mouth daily. 11/27/21   Margarita Mail, DO  Multiple Vitamin (MULTI-VITAMINS) TABS Take 1 tablet by mouth daily.    [provider]  NEOMYCIN-POLYMYXIN-HYDROCORTISONE (CORTISPORIN) 1 % SOLN OTIC solution Apply 1-2 drops to toe BID after soaking 01/27/23   Hyatt, Max T, DPM    Family History  Problem Relation Age of Onset   Alzheimer's  disease Mother    Dementia Mother    Early death Father        MVA   CVA Sister 23       complications of   Cancer Brother        stomach   Cancer Brother        lung   Breast cancer Neg Hx      Social History   Tobacco Use   Smoking status: Never   Smokeless tobacco: Former    Types: Snuff   Tobacco comments:    Used snuff, dips 3-4 dips/day, has used it 40-50 years. quit 10/2014.  Vaping Use   Vaping Use: Never used  Substance Use Topics   Alcohol use: No    Alcohol/week: 0.0 standard drinks of alcohol   Drug use: No    Allergies as of 04/01/2023   (No Known Allergies)    Review of Systems:    All systems reviewed and negative except where noted in HPI.   Physical Exam:  BP (!) 189/90    Pulse 67   Temp 98.5 F (36.9 C) (Oral)   Ht  (1.626 m)   Wt 119 lb 6.4 oz (54.2 kg)   BMI 20.49 kg/m  No LMP recorded. Patient is postmenopausal. Psych:  Alert and cooperative. Normal mood and affect. General:   Alert,  Well-developed, well-nourished, pleasant and cooperative in NAD Head:  Normocephalic and atraumatic. Eyes:  Sclera clear, no icterus.   Conjunctiva pink. Ears:  Normal auditory acuity. Neck:  Supple; no masses or thyromegaly. Lungs:  Respirations even and unlabored.  Clear throughout to auscultation.   No wheezes, crackles, or rhonchi. No acute distress. Heart:  Regular rate and rhythm; no murmurs, clicks, rubs, or gallops. Abdomen:  Normal bowel sounds.  No bruits.  Soft, non-tender and non-distended without masses, hepatosplenomegaly or hernias noted.  No guarding or rebound tenderness.    Neurologic:  Alert and oriented x3;  grossly normal neurologically. Psych:  Alert and cooperative. Normal mood and affect.  Imaging Studies: No results found.  Assessment and Plan:   Lauren Eaton is a 77 y.o. y/o female has been referred for change in bowel habits and unintentional weight loss.  No recent endoscopic evaluation.  Her increased frequency of bowel movements although she does not describe it as diarrhea could be related to excess consumption of sodas as well as candy which may be causing osmotic diarrhea.  Plan 1.  EGD and colonoscopy to evaluate unintentional weight loss will also evaluate for microscopic colitis at the same time 2.  Stop soda and candy consumption and see if bowel movement quality improves. 3.  If patient continues to lose weight and no abnormality seen on EGD and colonoscopy then would recommend [name], MD to consider CT scan of the chest abdomen pelvis to rule out any malignancy   I have discussed alternative options, risks & benefits,  which include, but are not limited to, bleeding, infection, perforation,respiratory  complication & drug reaction.  The patient agrees with this plan & written consent will be obtained.     Follow up in as needed  Dr [name] MD,MRCP(U.K)

## 2023-04-01 NOTE — Addendum Note (Signed)
Addended by: Adela Ports on: 04/01/2023 03:13 PM   Modules accepted: Orders

## 2023-04-02 ENCOUNTER — Ambulatory Visit: Payer: 59 | Admitting: Podiatry

## 2023-04-03 ENCOUNTER — Other Ambulatory Visit: Payer: Self-pay

## 2023-04-03 ENCOUNTER — Telehealth: Payer: Self-pay

## 2023-04-03 DIAGNOSIS — R194 Change in bowel habit: Secondary | ICD-10-CM

## 2023-04-03 DIAGNOSIS — R634 Abnormal weight loss: Secondary | ICD-10-CM

## 2023-04-03 MED ORDER — NA SULFATE-K SULFATE-MG SULF 17.5-3.13-1.6 GM/177ML PO SOLN: 2.0000 | Freq: Once | ORAL | 0 refills | Status: DC

## 2023-04-03 MED ORDER — NA SULFATE-K SULFATE-MG SULF 17.5-3.13-1.6 GM/177ML PO SOLN: 2.0000 | Freq: Once | ORAL | 0 refills | Status: AC

## 2023-04-03 NOTE — Telephone Encounter (Signed)
LVM for patient to return call to office at this time.Patient stated she would like to discuss with daughter prior to scheduling date for Colonoscopy.

## 2023-04-03 NOTE — Addendum Note (Signed)
Addended by: Martyn Ehrich on: 04/03/2023 09:31 AM   Modules accepted: Orders

## 2023-04-03 NOTE — Telephone Encounter (Signed)
Spoke with daughter Marylene Land and she would like to have EGD and Colonoscopy scheduled for her mother on 04/17/23. I let her know bowel prep will be sent to pharmacy and I will mail instructions to daughters home address.

## 2023-04-03 NOTE — Addendum Note (Signed)
Addended by: Martyn Ehrich on: 04/03/2023 09:25 AM   Modules accepted: Orders

## 2023-04-08 ENCOUNTER — Telehealth: Payer: Self-pay

## 2023-04-08 NOTE — Telephone Encounter (Signed)
Patients daughter Marylene Land [facility] checked) left voice message yesterday requesting to reschedule her mothers colonoscopy from 04/17/23 due to she is unable to get off work on this day.  Please call her back to reschedule.  Thank you,  Marcelino Duster, CMA

## 2023-04-09 NOTE — Telephone Encounter (Signed)
Patient daughter is calling to reschedule her mother colonoscopy because she can not get off work to bring her that day.

## 2023-04-09 NOTE — Telephone Encounter (Signed)
Spoke with Lauren Eaton patients daughter-she is requesting to reschedule Endoscopy/Colonoscopy from may 2 to May 15 due to not being able to get off work.  I spoke with Lauren Eaton and she has changed the date to May 15.

## 2023-04-17 ENCOUNTER — Encounter
Admission: RE | Disposition: A | Discharge status: Home / Self Care | Payer: Self-pay | Source: Home / Self Care | Attending: Gastroenterology

## 2023-04-17 SURGERY — COLONOSCOPY WITH PROPOFOL
Anesthesia: General

## 2023-04-22 ENCOUNTER — Ambulatory Visit: Payer: 59 | Admitting: Family Medicine

## 2023-04-23 NOTE — Progress Notes (Signed)
Patient asked me to call her daughter

## 2023-04-29 ENCOUNTER — Encounter: Payer: Self-pay | Admitting: Anesthesiology

## 2023-04-29 ENCOUNTER — Encounter: Payer: Self-pay | Admitting: Gastroenterology

## 2023-04-29 NOTE — Anesthesia Preprocedure Evaluation (Signed)
Anesthesia Evaluation  Patient identified by MRN, date of birth, ID band Patient awake    Reviewed: Allergy & Precautions, H&P , NPO status , Patient's Chart, lab work & pertinent test results  Airway Mallampati: II  TM Distance: >3 FB Neck ROM: full    Dental no notable dental hx.    Pulmonary neg pulmonary ROS   Pulmonary exam normal        Cardiovascular negative cardio ROS Normal cardiovascular exam     Neuro/Psych        Mild cognitive impairmentnegative neurological ROS     GI/Hepatic negative GI ROS, Neg liver ROS,,,  Endo/Other  Hypothyroidism    Renal/GU negative Renal ROS  negative genitourinary   Musculoskeletal   Abdominal   Peds  Hematology negative hematology ROS (+)   Anesthesia Other Findings Past Medical History: No date: Abnormal finding on thyroid function test     Comment:  recheck labs  No date: Abnormal glucose No date: Chronic insomnia No date: Dyslipidemia     Comment:  onset 07/20/10 No date: History of anemia No date: Hyperlipidemia No date: Mild cognitive impairment No date: Osteoporosis     Comment:  she took forteo in 2012, but has been off medication,               bone density is still showing osteoporosis, refer to               rheumatologist No date: Vitamin D deficiency  Past Surgical History: 11/16/2019: BREAST BIOPSY; Right     Comment:  rt mass stereo bx/ neg/ x clip No date: SURGERY OF LIP     Reproductive/Obstetrics negative OB ROS                              Anesthesia Physical Anesthesia Plan  ASA: 2  Anesthesia Plan: General   Post-op Pain Management:    Induction:   PONV Risk Score and Plan: Propofol infusion and TIVA  Airway Management Planned: Natural Airway  Additional Equipment:   Intra-op Plan:   Post-operative Plan:   Informed Consent:      Dental Advisory Given  Plan Discussed with: CRNA and  Surgeon  Anesthesia Plan Comments:          Anesthesia Quick Evaluation

## 2023-04-30 ENCOUNTER — Encounter
Admission: RE | Disposition: A | Discharge status: Home / Self Care | Payer: Self-pay | Source: Home / Self Care | Attending: Gastroenterology

## 2023-04-30 ENCOUNTER — Ambulatory Visit
Admission: RE | Admit: 2023-04-30 | Discharge: 2023-04-30 | Disposition: A | Discharge status: Home / Self Care | Payer: 59 | Attending: Gastroenterology | Admitting: Gastroenterology

## 2023-04-30 DIAGNOSIS — R634 Abnormal weight loss: Secondary | ICD-10-CM | POA: Diagnosis not present

## 2023-04-30 DIAGNOSIS — R194 Change in bowel habit: Secondary | ICD-10-CM | POA: Diagnosis not present

## 2023-04-30 DIAGNOSIS — Z538 Procedure and treatment not carried out for other reasons: Secondary | ICD-10-CM | POA: Insufficient documentation

## 2023-04-30 HISTORY — PX: COLONOSCOPY WITH PROPOFOL: SHX5780

## 2023-04-30 HISTORY — PX: ESOPHAGOGASTRODUODENOSCOPY (EGD) WITH PROPOFOL: SHX5813

## 2023-04-30 SURGERY — ESOPHAGOGASTRODUODENOSCOPY (EGD) WITH PROPOFOL
Anesthesia: General

## 2023-04-30 MED ORDER — SODIUM CHLORIDE 0.9 % IV SOLN: INTRAVENOUS | Status: DC

## 2023-04-30 NOTE — Progress Notes (Signed)
Patient not clean. Patient needs to reschedule

## 2023-05-01 ENCOUNTER — Telehealth: Payer: Self-pay

## 2023-05-01 ENCOUNTER — Encounter: Payer: Self-pay | Admitting: Gastroenterology

## 2023-05-01 ENCOUNTER — Other Ambulatory Visit: Payer: Self-pay

## 2023-05-01 DIAGNOSIS — R634 Abnormal weight loss: Secondary | ICD-10-CM

## 2023-05-01 DIAGNOSIS — R194 Change in bowel habit: Secondary | ICD-10-CM

## 2023-05-01 MED ORDER — NA SULFATE-K SULFATE-MG SULF 17.5-3.13-1.6 GM/177ML PO SOLN: 708.0000 mL | Freq: Once | ORAL | 0 refills | Status: AC

## 2023-05-01 NOTE — Telephone Encounter (Signed)
Called patient's daughter-Angela and she stated that her mom needed to reschedule her colonoscopy and EGD. Therefore, it was scheduled for 05/22/23. Instructions were provided and I also told her that I would be mailing them to her to help her mom to be cleaned out properly.

## 2023-05-01 NOTE — Telephone Encounter (Signed)
Patient daughter Lauren Eaton is calling which is on patient DPR form because she needs to reschedule patient colonoscopy. She states she was supposed to have it done yesterday but they were unable to perform the procedure.

## 2023-05-02 ENCOUNTER — Telehealth: Payer: Self-pay

## 2023-05-02 NOTE — Telephone Encounter (Signed)
Pt  daughter angela left message to reschedule colonoscopy until end of June please return call [phone]

## 2023-05-05 NOTE — Telephone Encounter (Signed)
Please read patient's phone call from 05/01/2023.

## 2023-05-21 ENCOUNTER — Encounter: Payer: Self-pay | Admitting: Gastroenterology

## 2023-05-22 ENCOUNTER — Encounter: Admission: RE | Payer: Self-pay | Source: Home / Self Care

## 2023-05-22 ENCOUNTER — Ambulatory Visit: Admission: RE | Admit: 2023-05-22 | Payer: 59 | Source: Home / Self Care | Admitting: Gastroenterology

## 2023-05-22 SURGERY — COLONOSCOPY WITH PROPOFOL
Anesthesia: General

## 2023-05-30 ENCOUNTER — Other Ambulatory Visit: Payer: Self-pay | Admitting: Family Medicine

## 2023-05-30 DIAGNOSIS — E039 Hypothyroidism, unspecified: Secondary | ICD-10-CM

## 2023-06-09 NOTE — Progress Notes (Unsigned)
Name: Lauren Eaton   MRN: [id number]    DOB: 1946/06/13   Date:06/10/2023       Progress Note  Subjective  Chief Complaint  Follow Up  HPI  Osteoporosis: seen by Dr. Gavin Potters n August 2016, and was started on Fosamax, she had a gap, but compliant again .She took almost two years of Forteo back in 2012 and 2013.Bone density done  03/2015  still showed osteoporosis. She had a repeat bone density done 10/25/2019 and stable. She resumed Fosamax Fall 2020 , tolerating medication well, taking vitamin D supplementation last vitamin D level was at goal , last bone density done 09/2022 showed no progression of osteoporosis but also no improvement, discussed referral to Endo again, but they want to hold off on that. Discussed high calcium diet    Hyperlipidemia: on statin therapy,  she denies side effects of medication.  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 Lives alone. Marland Kitchen MMS initially was  29 she has been taking Aricept 10 mg and denies side effects. Positive family history of Alzheimer's - mother, also has a sister with symptoms but not formally diagnosed with dementia. She was getting very frustrated with previous neighbor and was calling wicked, she moved out of the apartment complex and has been doing better now, symptoms stable for a while now. She is still  able to pay her own bills, has a social life with friends and still goes out to eat,, she does not like cooking anymore    Diarrhea: patient started having diarrhea since Spring 2023 . She had lost 15 lbs, ,baseline weight is between 128-130 lbs but is now between 118-119 lbs  She states appetite is normal, no abdominal pain or blood in stools. We checked multiple labs on her last visit and all normal. She takes some otc supplements and daughter states symptoms started around the same  time, she is taking a memory pills otc and cod oil pill. Advised to stop that. She saw Dr. Tobi Bastos and was supposed have an EGD and colonoscopy but had be cancelled due to poor prep. Dr Tobi Bastos also advised to cut down on sodas and candy bars, but she is still eating candy all the time. Explained she will have to do the colonoscopy on a Monday so she can stay with her daughter for the prep. Also explained due to her dementia she may not be able to have a good prep. They will think about what to do and discuss it with GI   Hypothyroidism: taking levothyroxine daily, weight is down and has diarrhea, no palpitation or anxiety symptoms . Last TSH was at goal but we will recheck it today   Patient Active Problem List   Diagnosis Date Noted   Hypothyroidism, adult 07/12/2022   Behavioral change 08/09/2016   Memory changes 08/09/2016   Coccyalgia 07/29/2015   Abnormal thyroid function test 07/20/2010   Dyslipidemia 07/20/2010   OP (osteoporosis) 07/26/2009   History of anemia 07/20/2009    Past Surgical History:  Procedure Laterality Date   BREAST BIOPSY Right 11/16/2019   rt mass stereo bx/ neg/ x clip   COLONOSCOPY WITH PROPOFOL N/A 04/30/2023   Procedure: COLONOSCOPY WITH PROPOFOL;  Surgeon: [name], MD;  Location: [facility] ENDOSCOPY;  Service: Gastroenterology;  Laterality: N/A;   ESOPHAGOGASTRODUODENOSCOPY (EGD) WITH PROPOFOL N/A 04/30/2023  Procedure: ESOPHAGOGASTRODUODENOSCOPY (EGD) WITH PROPOFOL;  Surgeon: [name], MD;  Location: [facility] ENDOSCOPY;  Service: Gastroenterology;  Laterality: N/A;   SURGERY OF LIP      Family History  Problem Relation Age of Onset   Alzheimer's disease Mother    Dementia Mother    Early death Father        MVA   CVA Sister 70       complications of   Cancer Brother        stomach   Cancer Brother        lung   Breast cancer Neg Hx     Social History   Tobacco Use   Smoking status: Never   Smokeless tobacco: Former    Types: Snuff   Tobacco  comments:    Used snuff, dips 3-4 dips/day, has used it 40-50 years. quit 10/2014.  Substance Use Topics   Alcohol use: No    Alcohol/week: 0.0 standard drinks of alcohol     Current Outpatient Medications:    alendronate (FOSAMAX) 70 MG tablet, TAKE 1 TABLET(70 MG) BY MOUTH 1 TIME A WEEK WITH A FULL GLASS OF WATER AND ON AN EMPTY STOMACH, Disp: 12 tablet, Rfl: 2   aspirin EC 81 MG tablet, Take 1 tablet (81 mg total) by mouth daily., Disp: 30 tablet, Rfl: 0   calcium-vitamin D (OSCAL-500) 500-400 MG-UNIT tablet, Take 1 tablet by mouth daily., Disp: , Rfl:    donepezil (ARICEPT) 10 MG tablet, Take 1 tablet (10 mg total) by mouth daily at 12 noon., Disp: 90 tablet, Rfl: 1   levothyroxine (SYNTHROID) 25 MCG tablet, TAKE 1 TABLET(25 MCG) BY MOUTH DAILY BEFORE BREAKFAST, Disp: 30 tablet, Rfl: 0   loratadine (CLARITIN) 10 MG tablet, Take 1 tablet (10 mg total) by mouth daily., Disp: 30 tablet, Rfl: 11   Multiple Vitamin (MULTI-VITAMINS) TABS, Take 1 tablet by mouth daily., Disp: , Rfl:    atorvastatin (LIPITOR) 40 MG tablet, TAKE 1 TABLET(40 MG) BY MOUTH DAILY, Disp: 90 tablet, Rfl: 1  No Known Allergies  I personally reviewed active problem list, medication list, allergies, family history, social history, health maintenance with the patient/caregiver today.   ROS  Constitutional: Negative for fever or weight change.  Respiratory: Negative for cough and shortness of breath.   Cardiovascular: Negative for chest pain or palpitations.  Gastrointestinal: Negative for abdominal pain, no bowel changes.  Musculoskeletal: Negative for gait problem or joint swelling.  Skin: Negative for rash.  Neurological: Negative for dizziness or headache.  No other specific complaints in a complete review of systems (except as listed in HPI above).   Objective  Vitals:   06/10/23 1020 06/10/23 1027  BP: (!) 142/82 136/80  Pulse: 71   Resp: 16   SpO2: 97%   Weight: 119 lb (54 kg)   Height: 5\' 4"  (1.626  m)     Body mass index is 20.43 kg/m.  Physical Exam  Constitutional: Patient appears well-developed and malnourished  No distress.  HEENT: head atraumatic, normocephalic, pupils equal and reactive to light, neck supple Cardiovascular: Normal rate, regular rhythm and normal heart sounds.  No murmur heard. No BLE edema. Pulmonary/Chest: Effort normal and breath sounds normal. No respiratory distress. Abdominal: Soft.  There is no tenderness. Psychiatric: Patient has a normal mood and affect. behavior is normal. Judgment and thought content normal.   PHQ2/9:    06/10/2023   10:19 AM 03/20/2023   11:37 AM 11/15/2022    7:39 AM 07/12/2022  9:18 AM 03/14/2022   11:47 AM  Depression screen PHQ 2/9  Decreased Interest 0 0 0 0 0  Down, Depressed, Hopeless 0 0 0 0 0  PHQ - 2 Score 0 0 0 0 0  Altered sleeping 0  0 0   Tired, decreased energy 0  0 0   Change in appetite 0  1 0   Feeling bad or failure about yourself  0  0 0   Trouble concentrating 0  0 0   Moving slowly or fidgety/restless 0  0 0   Suicidal thoughts 0  0 0   PHQ-9 Score 0  1 0   Difficult doing work/chores    Not difficult at all     phq 9 is negative   Fall Risk:    06/10/2023   10:19 AM 03/20/2023   11:35 AM 11/15/2022    7:39 AM 07/12/2022    9:18 AM 03/14/2022   11:48 AM  Fall Risk   Falls in the past year? 0 0 0 0 0  Number falls in past yr: 0 0 0 0 0  Injury with Fall? 0 0 0 0 0  Risk for fall due to : No Fall Risks No Fall Risks No Fall Risks No Fall Risks No Fall Risks  Follow up Falls prevention discussed Education provided;Falls prevention discussed Falls prevention discussed Falls prevention discussed;Education provided Falls prevention discussed      Functional Status Survey: Is the patient deaf or have difficulty hearing?: No Does the patient have difficulty seeing, even when wearing glasses/contacts?: No Does the patient have difficulty concentrating, remembering, or making decisions?: Yes Does  the patient have difficulty walking or climbing stairs?: No Does the patient have difficulty dressing or bathing?: No Does the patient have difficulty doing errands alone such as visiting a doctor's office or shopping?: No    Assessment & Plan  1. Dyslipidemia  - atorvastatin (LIPITOR) 40 MG tablet; TAKE 1 TABLET(40 MG) BY MOUTH DAILY  Dispense: 90 tablet; Refill: 1 - Lipid panel  2. Mild protein-calorie malnutrition (HCC)  - CBC with Differential/Platelet - COMPLETE METABOLIC PANEL WITH GFR  3. Age-related osteoporosis without current pathological fracture  - VITAMIN D 25 Hydroxy (Vit-D Deficiency, Fractures)  4. Hypothyroidism, adult  - TSH  5. Long-term use of high-risk medication  - CBC with Differential/Platelet - COMPLETE METABOLIC PANEL WITH GFR  6. White coat syndrome without diagnosis of hypertension   7. Mild cognitive impairment  - donepezil (ARICEPT) 10 MG tablet; Take 1 tablet (10 mg total) by mouth daily at 12 noon.  Dispense: 90 tablet; Refill: 1    8. Breast cancer screening by mammogram  - MM 3D SCREENING MAMMOGRAM BILATERAL BREAST; Future

## 2023-06-10 ENCOUNTER — Encounter: Payer: Self-pay | Admitting: Family Medicine

## 2023-06-10 ENCOUNTER — Ambulatory Visit (INDEPENDENT_AMBULATORY_CARE_PROVIDER_SITE_OTHER): Payer: 59 | Admitting: Family Medicine

## 2023-06-10 VITALS — BP 136/80 | HR 71 | Resp 16 | Ht 64.0 in | Wt 119.0 lb

## 2023-06-10 DIAGNOSIS — E039 Hypothyroidism, unspecified: Secondary | ICD-10-CM | POA: Diagnosis not present

## 2023-06-10 DIAGNOSIS — G3184 Mild cognitive impairment, so stated: Secondary | ICD-10-CM | POA: Diagnosis not present

## 2023-06-10 DIAGNOSIS — R030 Elevated blood-pressure reading, without diagnosis of hypertension: Secondary | ICD-10-CM

## 2023-06-10 DIAGNOSIS — E441 Mild protein-calorie malnutrition: Secondary | ICD-10-CM | POA: Diagnosis not present

## 2023-06-10 DIAGNOSIS — E785 Hyperlipidemia, unspecified: Secondary | ICD-10-CM | POA: Diagnosis not present

## 2023-06-10 DIAGNOSIS — M810 Age-related osteoporosis without current pathological fracture: Secondary | ICD-10-CM | POA: Diagnosis not present

## 2023-06-10 DIAGNOSIS — Z1231 Encounter for screening mammogram for malignant neoplasm of breast: Secondary | ICD-10-CM

## 2023-06-10 DIAGNOSIS — Z79899 Other long term (current) drug therapy: Secondary | ICD-10-CM

## 2023-06-10 LAB — CBC WITH DIFFERENTIAL/PLATELET
Basophils Absolute: 31 cells/uL (ref 0–200)
Basophils Relative: 0.5 %
Eosinophils Absolute: 81 cells/uL (ref 15–500)
Eosinophils Relative: 1.3 %
MCV: 92.1 fL (ref 80.0–100.0)
Monocytes Relative: 5.7 %
RDW: 12.3 % (ref 11.0–15.0)

## 2023-06-10 MED ORDER — ALENDRONATE SODIUM 70 MG PO TABS: ORAL_TABLET | ORAL | 3 refills | Status: DC

## 2023-06-10 MED ORDER — ATORVASTATIN CALCIUM 40 MG PO TABS: ORAL_TABLET | ORAL | 1 refills | Status: DC

## 2023-06-10 MED ORDER — DONEPEZIL HCL 10 MG PO TABS: 10.0000 mg | ORAL_TABLET | Freq: Every day | ORAL | 1 refills | Status: DC

## 2023-06-11 LAB — COMPLETE METABOLIC PANEL WITH GFR
AG Ratio: 1.3 (calc) (ref 1.0–2.5)
ALT: 17 U/L (ref 6–29)
AST: 22 U/L (ref 10–35)
Albumin: 4.1 g/dL (ref 3.6–5.1)
Alkaline phosphatase (APISO): 61 U/L (ref 37–153)
BUN: 11 mg/dL (ref 7–25)
CO2: 28 mmol/L (ref 20–32)
Calcium: 9.3 mg/dL (ref 8.6–10.4)
Chloride: 106 mmol/L (ref 98–110)
Creat: 0.81 mg/dL (ref 0.60–1.00)
Globulin: 3.1 g/dL (calc) (ref 1.9–3.7)
Glucose, Bld: 77 mg/dL (ref 65–99)
Potassium: 4.1 mmol/L (ref 3.5–5.3)
Sodium: 142 mmol/L (ref 135–146)
Total Bilirubin: 0.9 mg/dL (ref 0.2–1.2)
Total Protein: 7.2 g/dL (ref 6.1–8.1)
eGFR: 76 mL/min/{1.73_m2} (ref >=60)

## 2023-06-11 LAB — CBC WITH DIFFERENTIAL/PLATELET
Absolute Monocytes: 353 cells/uL (ref 200–950)
HCT: 36.1 % (ref 35.0–45.0)
Hemoglobin: 11.8 g/dL (ref 11.7–15.5)
Lymphs Abs: 1618 cells/uL (ref 850–3900)
MCH: 30.1 pg (ref 27.0–33.0)
MCHC: 32.7 g/dL (ref 32.0–36.0)
MPV: 9.9 fL (ref 7.5–12.5)
Neutro Abs: 4117 cells/uL (ref 1500–7800)
Neutrophils Relative %: 66.4 %
Platelets: 250 10*3/uL (ref 140–400)
RBC: 3.92 10*6/uL (ref 3.80–5.10)
Total Lymphocyte: 26.1 %
WBC: 6.2 10*3/uL (ref 3.8–10.8)

## 2023-06-11 LAB — VITAMIN D 25 HYDROXY (VIT D DEFICIENCY, FRACTURES): Vit D, 25-Hydroxy: 69 ng/mL (ref 30–100)

## 2023-06-11 LAB — LIPID PANEL
Cholesterol: 132 mg/dL (ref <200)
HDL: 62 mg/dL (ref >=50)
LDL Cholesterol (Calc): 56 mg/dL (calc)
Non-HDL Cholesterol (Calc): 70 mg/dL (calc) (ref <130)
Total CHOL/HDL Ratio: 2.1 (calc) (ref <5.0)
Triglycerides: 59 mg/dL (ref <150)

## 2023-06-11 LAB — TSH: TSH: 3.85 mIU/L (ref 0.40–4.50)

## 2023-09-03 ENCOUNTER — Other Ambulatory Visit: Payer: Self-pay | Admitting: Family Medicine

## 2023-09-03 DIAGNOSIS — E039 Hypothyroidism, unspecified: Secondary | ICD-10-CM

## 2023-09-18 ENCOUNTER — Ambulatory Visit
Admission: RE | Admit: 2023-09-18 | Discharge: 2023-09-18 | Disposition: A | Discharge status: Home / Self Care | Payer: 59 | Source: Ambulatory Visit | Attending: Family Medicine | Admitting: Family Medicine

## 2023-09-18 DIAGNOSIS — Z1231 Encounter for screening mammogram for malignant neoplasm of breast: Secondary | ICD-10-CM | POA: Insufficient documentation

## 2023-10-13 NOTE — Progress Notes (Unsigned)
There were no vitals taken for this visit.   Subjective:    Patient ID: Lauren Eaton, female    DOB: [birth year], 77 y.o.   MRN: [id number]  HPI: Lauren Eaton is a 77 y.o. female  No chief complaint on file.   Relevant past medical, surgical, family and social history reviewed and updated as indicated. Interim medical history since our last visit reviewed. Allergies and medications reviewed and updated.  Review of Systems  Ten systems reviewed and is negative except as mentioned in HPI ***      Objective:    There were no vitals taken for this visit.  Wt Readings from Last 3 Encounters:  06/10/23 119 lb (54 kg)  04/01/23 119 lb 6.4 oz (54.2 kg)  03/20/23 118 lb (53.5 kg)    Physical Exam  Constitutional: Patient appears well-developed and well-nourished. Obese *** No distress.  HEENT: head atraumatic, normocephalic, pupils equal and reactive to light, ears ***, neck supple, throat within normal limits Cardiovascular: Normal rate, regular rhythm and normal heart sounds.  No murmur heard. No BLE edema. Pulmonary/Chest: Effort normal and breath sounds normal. No respiratory distress. Abdominal: Soft.  There is no tenderness. Psychiatric: Patient has a normal mood and affect. behavior is normal. Judgment and thought content normal.  Results for orders placed or performed in visit on 06/10/23  Lipid panel  Result Value Ref Range   Cholesterol 132 <200 mg/dL   HDL 62 > OR = 50 mg/dL   Triglycerides 59 <409 mg/dL   LDL Cholesterol (Calc) 56 mg/dL (calc)   Total CHOL/HDL Ratio 2.1 <5.0 (calc)   Non-HDL Cholesterol (Calc) 70 <811 mg/dL (calc)  CBC with Differential/Platelet  Result Value Ref Range   WBC 6.2 3.8 - 10.8 Thousand/uL   RBC 3.92 3.80 - 5.10 Million/uL   Hemoglobin 11.8 11.7 - 15.5 g/dL   HCT 91.4 78.2 - 95.6 %   MCV 92.1 80.0 - 100.0 fL   MCH 30.1 27.0 - 33.0 pg   MCHC 32.7 32.0 - 36.0 g/dL   RDW 21.3 08.6 - 57.8 %   Platelets 250 140 - 400 Thousand/uL    MPV 9.9 7.5 - 12.5 fL   Neutro Abs 4,117 1,500 - 7,800 cells/uL   Lymphs Abs 1,618 850 - 3,900 cells/uL   Absolute Monocytes 353 200 - 950 cells/uL   Eosinophils Absolute 81 15 - 500 cells/uL   Basophils Absolute 31 0 - 200 cells/uL   Neutrophils Relative % 66.4 %   Total Lymphocyte 26.1 %   Monocytes Relative 5.7 %   Eosinophils Relative 1.3 %   Basophils Relative 0.5 %  COMPLETE METABOLIC PANEL WITH GFR  Result Value Ref Range   Glucose, Bld 77 65 - 99 mg/dL   BUN 11 7 - 25 mg/dL   Creat 4.69 6.29 - 5.28 mg/dL   eGFR 76 > OR = 60 UX/LKG/4.01U2   BUN/Creatinine Ratio SEE NOTE: 6 - 22 (calc)   Sodium 142 135 - 146 mmol/L   Potassium 4.1 3.5 - 5.3 mmol/L   Chloride 106 98 - 110 mmol/L   CO2 28 20 - 32 mmol/L   Calcium 9.3 8.6 - 10.4 mg/dL   Total Protein 7.2 6.1 - 8.1 g/dL   Albumin 4.1 3.6 - 5.1 g/dL   Globulin 3.1 1.9 - 3.7 g/dL (calc)   AG Ratio 1.3 1.0 - 2.5 (calc)   Total Bilirubin 0.9 0.2 - 1.2 mg/dL   Alkaline phosphatase (APISO) 61 37 - 153 U/L  AST 22 10 - 35 U/L   ALT 17 6 - 29 U/L  TSH  Result Value Ref Range   TSH 3.85 0.40 - 4.50 mIU/L  VITAMIN D 25 Hydroxy (Vit-D Deficiency, Fractures)  Result Value Ref Range   Vit D, 25-Hydroxy 69 30 - 100 ng/mL      Assessment & Plan:   Problem List Items Addressed This Visit   None    Follow up plan: No follow-ups on file.

## 2023-10-14 ENCOUNTER — Encounter: Payer: Self-pay | Admitting: Nurse Practitioner

## 2023-10-14 ENCOUNTER — Other Ambulatory Visit: Payer: Self-pay

## 2023-10-14 ENCOUNTER — Ambulatory Visit (INDEPENDENT_AMBULATORY_CARE_PROVIDER_SITE_OTHER): Payer: 59 | Admitting: Nurse Practitioner

## 2023-10-14 VITALS — BP 146/80 | HR 69 | Temp 97.8°F | Resp 16 | Ht 64.0 in | Wt 116.3 lb

## 2023-10-14 DIAGNOSIS — R197 Diarrhea, unspecified: Secondary | ICD-10-CM | POA: Diagnosis not present

## 2023-10-14 DIAGNOSIS — R110 Nausea: Secondary | ICD-10-CM | POA: Diagnosis not present

## 2023-10-14 DIAGNOSIS — R634 Abnormal weight loss: Secondary | ICD-10-CM

## 2023-10-15 LAB — CBC WITH DIFFERENTIAL/PLATELET
Absolute Lymphocytes: 1769 {cells}/uL (ref 850–3900)
Absolute Monocytes: 476 {cells}/uL (ref 200–950)
Basophils Absolute: 18 {cells}/uL (ref 0–200)
Basophils Relative: 0.3 %
Eosinophils Absolute: 43 {cells}/uL (ref 15–500)
Eosinophils Relative: 0.7 %
HCT: 38 % (ref 35.0–45.0)
Hemoglobin: 12.1 g/dL (ref 11.7–15.5)
MCH: 30.1 pg (ref 27.0–33.0)
MCHC: 31.8 g/dL — ABNORMAL LOW (ref 32.0–36.0)
MCV: 94.5 fL (ref 80.0–100.0)
MPV: 10.2 fL (ref 7.5–12.5)
Monocytes Relative: 7.8 %
Neutro Abs: 3794 {cells}/uL (ref 1500–7800)
Neutrophils Relative %: 62.2 %
Platelets: 268 10*3/uL (ref 140–400)
RBC: 4.02 10*6/uL (ref 3.80–5.10)
RDW: 12.3 % (ref 11.0–15.0)
Total Lymphocyte: 29 %
WBC: 6.1 10*3/uL (ref 3.8–10.8)

## 2023-10-15 LAB — COMPLETE METABOLIC PANEL WITH GFR
AG Ratio: 1.3 (calc) (ref 1.0–2.5)
ALT: 15 U/L (ref 6–29)
AST: 23 U/L (ref 10–35)
Albumin: 4.2 g/dL (ref 3.6–5.1)
Alkaline phosphatase (APISO): 58 U/L (ref 37–153)
BUN: 12 mg/dL (ref 7–25)
CO2: 30 mmol/L (ref 20–32)
Calcium: 9.7 mg/dL (ref 8.6–10.4)
Chloride: 105 mmol/L (ref 98–110)
Creat: 0.72 mg/dL (ref 0.60–1.00)
Globulin: 3.2 g/dL (ref 1.9–3.7)
Glucose, Bld: 82 mg/dL (ref 65–99)
Potassium: 4.9 mmol/L (ref 3.5–5.3)
Sodium: 141 mmol/L (ref 135–146)
Total Bilirubin: 0.9 mg/dL (ref 0.2–1.2)
Total Protein: 7.4 g/dL (ref 6.1–8.1)
eGFR: 87 mL/min/{1.73_m2} (ref >=60)

## 2023-10-15 LAB — LIPASE: Lipase: 30 U/L (ref 7–60)

## 2023-10-16 DIAGNOSIS — R197 Diarrhea, unspecified: Secondary | ICD-10-CM | POA: Diagnosis not present

## 2023-10-16 DIAGNOSIS — R634 Abnormal weight loss: Secondary | ICD-10-CM | POA: Diagnosis not present

## 2023-10-16 DIAGNOSIS — R110 Nausea: Secondary | ICD-10-CM | POA: Diagnosis not present

## 2023-10-21 LAB — OVA AND PARASITE EXAMINATION
CONCENTRATE RESULT:: NONE SEEN
MICRO NUMBER:: 15670594
SPECIMEN QUALITY:: ADEQUATE
TRICHROME RESULT:: NONE SEEN

## 2023-10-21 LAB — SALMONELLA/SHIGELLA CULT, CAMPY EIA AND SHIGA TOXIN RFL ECOLI
MICRO NUMBER: 15670591
MICRO NUMBER:: 15670592
MICRO NUMBER:: 15670593
Result:: NOT DETECTED
SHIGA RESULT:: NOT DETECTED
SPECIMEN QUALITY: ADEQUATE
SPECIMEN QUALITY:: ADEQUATE
SPECIMEN QUALITY:: ADEQUATE

## 2023-10-21 LAB — GIARDIA AND CRYPTOSPORIDIUM ANTIGEN PANEL
MICRO NUMBER:: 15670577
RESULT:: NOT DETECTED
SPECIMEN QUALITY:: ADEQUATE
Specimen Quality:: ADEQUATE
micro Number:: 15670576

## 2023-10-21 LAB — CLOSTRIDIUM DIFFICILE CULTURE-FECAL

## 2023-11-01 ENCOUNTER — Other Ambulatory Visit: Payer: Self-pay | Admitting: Nurse Practitioner

## 2023-11-01 ENCOUNTER — Ambulatory Visit (HOSPITAL_BASED_OUTPATIENT_CLINIC_OR_DEPARTMENT_OTHER)
Admission: RE | Admit: 2023-11-01 | Discharge: 2023-11-01 | Disposition: A | Discharge status: Home / Self Care | Payer: 59 | Source: Ambulatory Visit | Attending: Nurse Practitioner | Admitting: Nurse Practitioner

## 2023-11-01 DIAGNOSIS — J9811 Atelectasis: Secondary | ICD-10-CM | POA: Diagnosis not present

## 2023-11-01 DIAGNOSIS — R634 Abnormal weight loss: Secondary | ICD-10-CM | POA: Diagnosis not present

## 2023-11-01 DIAGNOSIS — N2889 Other specified disorders of kidney and ureter: Secondary | ICD-10-CM | POA: Diagnosis not present

## 2023-11-01 DIAGNOSIS — R110 Nausea: Secondary | ICD-10-CM

## 2023-11-01 DIAGNOSIS — N852 Hypertrophy of uterus: Secondary | ICD-10-CM | POA: Insufficient documentation

## 2023-11-01 DIAGNOSIS — R197 Diarrhea, unspecified: Secondary | ICD-10-CM

## 2023-11-01 DIAGNOSIS — N289 Disorder of kidney and ureter, unspecified: Secondary | ICD-10-CM | POA: Diagnosis not present

## 2023-11-01 MED ORDER — IOHEXOL 9 MG/ML PO SOLN: 500.0000 mL | ORAL | Status: AC

## 2023-11-01 MED ORDER — IOHEXOL 350 MG/ML SOLN
80.0000 mL | Freq: Once | INTRAVENOUS | Status: AC | PRN
  Administered 2023-11-01: 80 mL via INTRAVENOUS

## 2023-11-17 ENCOUNTER — Other Ambulatory Visit: Payer: Self-pay | Admitting: Nurse Practitioner

## 2023-11-17 DIAGNOSIS — R634 Abnormal weight loss: Secondary | ICD-10-CM

## 2023-11-17 DIAGNOSIS — R9389 Abnormal findings on diagnostic imaging of other specified body structures: Secondary | ICD-10-CM

## 2023-11-18 ENCOUNTER — Telehealth: Payer: Self-pay | Admitting: Family Medicine

## 2023-11-18 ENCOUNTER — Telehealth: Payer: Self-pay

## 2023-11-18 NOTE — Telephone Encounter (Signed)
Returned call to patient's daughter Marylene Land and relayed information per Debbe Odea as she stated patient does only have the insurances on file.

## 2023-11-18 NOTE — Telephone Encounter (Signed)
Daughter Marylene Land called stated she was advised to contact [facility] to have provider call [facility] to preauthorize patients ultrasound that is scheduled on 11/22/2023.

## 2023-11-18 NOTE — Telephone Encounter (Signed)
Copied from CRM [id number]. Topic: General - Other >> Nov 18, 2023  9:01 AM Marlow Baars wrote: Reason for CRM: The daughter of the patient called in upon request of Dois Davenport for CT results. Please assist patient further

## 2023-11-18 NOTE — Telephone Encounter (Signed)
Lauren Eaton ordered them

## 2023-11-18 NOTE — Telephone Encounter (Signed)
Daughter notified 

## 2023-11-22 ENCOUNTER — Ambulatory Visit (HOSPITAL_BASED_OUTPATIENT_CLINIC_OR_DEPARTMENT_OTHER)
Admission: RE | Admit: 2023-11-22 | Discharge: 2023-11-22 | Disposition: A | Discharge status: Home / Self Care | Payer: 59 | Source: Ambulatory Visit | Attending: Nurse Practitioner

## 2023-11-22 DIAGNOSIS — R634 Abnormal weight loss: Secondary | ICD-10-CM

## 2023-11-22 DIAGNOSIS — R9389 Abnormal findings on diagnostic imaging of other specified body structures: Secondary | ICD-10-CM

## 2023-11-22 DIAGNOSIS — N2889 Other specified disorders of kidney and ureter: Secondary | ICD-10-CM | POA: Diagnosis not present

## 2023-11-24 ENCOUNTER — Other Ambulatory Visit: Payer: Self-pay | Admitting: Nurse Practitioner

## 2023-11-24 DIAGNOSIS — D219 Benign neoplasm of connective and other soft tissue, unspecified: Secondary | ICD-10-CM

## 2023-11-24 NOTE — Progress Notes (Signed)
Name: Lauren Eaton   MRN: [id number]    DOB: 1946-07-15   Date:12/12/2023       Progress Note  Subjective  Chief Complaint  Chief Complaint  Patient presents with   Medical Management of Chronic Issues    HPI  Discussed the use of AI scribe software for clinical note transcription with the patient, who gave verbal consent to proceed.  History of Present Illness   The patient, with a history of hypertension, hypothyroidism, and mild cognitive dysfunction, presented for a six-month follow-up. She reported no chest pain or palpitations. However, her blood pressure readings have been consistently high, with the most recent reading at 152/92. The patient admitted to consuming salty snacks, particularly pork skins, which could contribute to the elevated blood pressure.  In addition, the patient has been experiencing constipation, which she initially mistook for diarrhea due to soiling. Despite this, she denied any abdominal pain. A CT scan of the abdomen revealed two lesions on the right kidney and a slightly enlarged uterus, possibly due to fibroid tumors. A subsequent renal ultrasound suggested a complicated cyst, and a pelvic ultrasound confirmed the presence of fibroid tumors.  The patient also reported transient left calf pain that started about a week ago but has since resolved. She denied any associated swelling or cramping.  Regarding her chronic conditions, the patient confirmed adherence to her medication regimen, which includes Aricept for cognitive dysfunction, atorvastatin for dyslipidemia, and Fosamax for osteoporosis. She reported no issues with medication administration and denied any stomach pain.  The patient lives independently and manages her own finances. She reported a slight weight loss from 118 to 115 pounds over the past year. Despite this, she continues to maintain an active lifestyle, including going out to eat with friends.         Patient Active Problem List    Diagnosis Date Noted   Hypothyroidism, adult 07/12/2022   Behavioral change 08/09/2016   Memory changes 08/09/2016   Coccyalgia 07/29/2015   Abnormal thyroid function test 07/20/2010   Dyslipidemia 07/20/2010   OP (osteoporosis) 07/26/2009   History of anemia 07/20/2009    Past Surgical History:  Procedure Laterality Date   BREAST BIOPSY Right 11/16/2019   rt mass stereo bx/ neg/ x clip   COLONOSCOPY WITH PROPOFOL N/A 04/30/2023   Procedure: COLONOSCOPY WITH PROPOFOL;  Surgeon: [name], MD;  Location: [facility] ENDOSCOPY;  Service: Gastroenterology;  Laterality: N/A;   ESOPHAGOGASTRODUODENOSCOPY (EGD) WITH PROPOFOL N/A 04/30/2023   Procedure: ESOPHAGOGASTRODUODENOSCOPY (EGD) WITH PROPOFOL;  Surgeon: [name], MD;  Location: [facility] ENDOSCOPY;  Service: Gastroenterology;  Laterality: N/A;   SURGERY OF LIP      Family History  Problem Relation Age of Onset   Alzheimer's disease Mother    Dementia Mother    Early death Father        MVA   CVA Sister 7       complications of   Cancer Brother        stomach   Cancer Brother        lung   Breast cancer Neg Hx     Social History   Tobacco Use   Smoking status: Never   Smokeless tobacco: Former    Types: Snuff   Tobacco comments:    Used snuff, dips 3-4 dips/day, has used it 40-50 years. quit 10/2014.  Substance Use Topics   Alcohol use: No    Alcohol/week: 0.0 standard drinks of alcohol     Current Outpatient Medications:  alendronate (FOSAMAX) 70 MG tablet, TAKE 1 TABLET(70 MG) BY MOUTH 1 TIME A WEEK WITH A FULL GLASS OF WATER AND ON AN EMPTY STOMACH, Disp: 12 tablet, Rfl: 3   aspirin EC 81 MG tablet, Take 1 tablet (81 mg total) by mouth daily., Disp: 30 tablet, Rfl: 0   atorvastatin (LIPITOR) 40 MG tablet, TAKE 1 TABLET(40 MG) BY MOUTH DAILY, Disp: 90 tablet, Rfl: 1   calcium-vitamin D (OSCAL-500) 500-400 MG-UNIT tablet, Take 1 tablet by mouth daily., Disp: , Rfl:    donepezil (ARICEPT) 10 MG tablet, Take 1 tablet  (10 mg total) by mouth daily at 12 noon., Disp: 90 tablet, Rfl: 1   levothyroxine (SYNTHROID) 25 MCG tablet, TAKE 1 TABLET BY MOUTH DAILY BEFORE BREAKFAST, Disp: 90 tablet, Rfl: 1   loratadine (CLARITIN) 10 MG tablet, Take 1 tablet (10 mg total) by mouth daily., Disp: 30 tablet, Rfl: 11   Multiple Vitamin (MULTI-VITAMINS) TABS, Take 1 tablet by mouth daily., Disp: , Rfl:   No Known Allergies  I personally reviewed active problem list, medication list, allergies, family history, social history with the patient/caregiver today.   ROS  Ten systems reviewed and is negative except as mentioned in HPI    Objective  Vitals:   12/12/23 0953  BP: (!) 152/92  Pulse: 73  Resp: 16  Temp: 98 F (36.7 C)  TempSrc: Oral  SpO2: 98%  Weight: 115 lb 8 oz (52.4 kg)  Height: 5\' 4"  (1.626 m)    Body mass index is 19.83 kg/m.  Physical Exam Constitutional: Patient appears well-developed and well-nourished. No distress.  HEENT: head atraumatic, normocephalic, pupils equal and reactive to light, ,neck supple, throat within normal limits Cardiovascular: Normal rate, regular rhythm and normal heart sounds.  No murmur heard. No BLE edema. Pulmonary/Chest: Effort normal and breath sounds normal. No respiratory distress. Abdominal: Soft.  There is no tenderness. Psychiatric: Patient has a normal mood and affect. behavior is normal. Judgment and thought content normal.   Recent Results (from the past 2160 hours)  CBC with Differential/Platelet     Status: Abnormal   Collection Time: 10/14/23  8:18 AM  Result Value Ref Range   WBC 6.1 3.8 - 10.8 Thousand/uL   RBC 4.02 3.80 - 5.10 Million/uL   Hemoglobin 12.1 11.7 - 15.5 g/dL   HCT 66.4 40.3 - 47.4 %   MCV 94.5 80.0 - 100.0 fL   MCH 30.1 27.0 - 33.0 pg   MCHC 31.8 (L) 32.0 - 36.0 g/dL    Comment: For adults, a slight decrease in the calculated MCHC value (in the range of 30 to 32 g/dL) is most likely not clinically significant; however, it  should be interpreted with caution in correlation with other red cell parameters and the patient's clinical condition.    RDW 12.3 11.0 - 15.0 %   Platelets 268 140 - 400 Thousand/uL   MPV 10.2 7.5 - 12.5 fL   Neutro Abs 3,794 1,500 - 7,800 cells/uL   Absolute Lymphocytes 1,769 850 - 3,900 cells/uL   Absolute Monocytes 476 200 - 950 cells/uL   Eosinophils Absolute 43 15 - 500 cells/uL   Basophils Absolute 18 0 - 200 cells/uL   Neutrophils Relative % 62.2 %   Total Lymphocyte 29.0 %   Monocytes Relative 7.8 %   Eosinophils Relative 0.7 %   Basophils Relative 0.3 %  COMPLETE METABOLIC PANEL WITH GFR     Status: None   Collection Time: 10/14/23  8:18 AM  Result  Value Ref Range   Glucose, Bld 82 65 - 99 mg/dL    Comment: .            Fasting reference interval .    BUN 12 7 - 25 mg/dL   Creat 1.61 0.96 - 0.45 mg/dL   eGFR 87 > OR = 60 WU/JWJ/1.91Y7   BUN/Creatinine Ratio SEE NOTE: 6 - 22 (calc)    Comment:    Not Reported: BUN and Creatinine are within    reference range. .    Sodium 141 135 - 146 mmol/L   Potassium 4.9 3.5 - 5.3 mmol/L   Chloride 105 98 - 110 mmol/L   CO2 30 20 - 32 mmol/L   Calcium 9.7 8.6 - 10.4 mg/dL   Total Protein 7.4 6.1 - 8.1 g/dL   Albumin 4.2 3.6 - 5.1 g/dL   Globulin 3.2 1.9 - 3.7 g/dL (calc)   AG Ratio 1.3 1.0 - 2.5 (calc)   Total Bilirubin 0.9 0.2 - 1.2 mg/dL   Alkaline phosphatase (APISO) 58 37 - 153 U/L   AST 23 10 - 35 U/L   ALT 15 6 - 29 U/L  Lipase     Status: None   Collection Time: 10/14/23  8:18 AM  Result Value Ref Range   Lipase 30 7 - 60 U/L  Ova and parasite examination     Status: None   Collection Time: 10/16/23  8:18 AM   Specimen: Stool  Result Value Ref Range   MICRO NUMBER: [id number]    SPECIMEN QUALITY: Adequate    Source STOOL    STATUS: FINAL    CONCENTRATE RESULT: No ova or parasites seen    TRICHROME RESULT: No ova or parasites seen    COMMENT:      Routine Ova and Parasite exam may not detect some parasites  that occasionally cause diarrheal illness. Cryptosporidium Antigen and/or Cyclospora Isospora Exam may be ordered to detect these parasites.  For additional information, please refer to  https://education.[facility].com/faq/FAQ203 (This link is being provided for informational/ educational purposes only.)   Salmonella/Shigella Cult, Campy EIA and Shiga Toxin reflex     Status: None   Collection Time: 10/16/23  8:18 AM  Result Value Ref Range   MICRO NUMBER [id number]    SPECIMEN QUALITY Adequate    SOURCE: STOOL    STATUS: FINAL    Result: Not Detected     Comment: Reference Range:Not Detected   MICRO NUMBER: [id number]    SPECIMEN QUALITY: Adequate    SOURCE: STOOL    STATUS: FINAL    SHIGA RESULT: Not Detected     Comment: Reference Range:Not Detected   MICRO NUMBER: [id number]    SPECIMEN QUALITY: Adequate    SOURCE: STOOL    STATUS: FINAL    SS RESULT: No Salmonella or Shigella isolated   Clostridium difficile culture-fecal     Status: None   Collection Time: 10/16/23  8:18 AM   Specimen: Stool  Result Value Ref Range   Result-CDIFCU see note     Comment: CDIFF CULT W/RFLX TO TOX C. DIFFICILE CULTURE SOURCE : STOOL  RESULT/COMMENT: . No Clostridium difficile isolated. . . For additional information, please refer to http://education.[facility].com/faq/FAQ136 (This link is being provided for informational/ educational purposes only.) . C DIFF TOX B QL PCR C DIFF TOX B QL PCR             Not indicated   Giardia and Cryptosporidium Antigen Panel     Status: None  Collection Time: 10/16/23  8:18 AM  Result Value Ref Range   micro Number: [id number]    Specimen Quality: Adequate    Source STOOL    Status: FINAL    Cryptosporidium Antigen, EIA      Not Detected NOTE: Due to intermittent shedding, one negative sample does not necessarily rule out the presence of a parasitic infection.    Comment: Reference Range:Not Detected   MICRO NUMBER: [id number]    SPECIMEN  QUALITY: Adequate    Source: STOOL    STATUS: FINAL    RESULT: Not Detected     Comment: Reference Range:Not Detected   COMMENT:      NOTE: Due to intermittent shedding, one negative sample does not necessarily rule out the presence of a parasitic infection.      PHQ2/9:    12/12/2023    9:52 AM 10/14/2023    7:48 AM 06/10/2023   10:19 AM 03/20/2023   11:37 AM 11/15/2022    7:39 AM  Depression screen PHQ 2/9  Decreased Interest 0 0 0 0 0  Down, Depressed, Hopeless 0 0 0 0 0  PHQ - 2 Score 0 0 0 0 0  Altered sleeping 0  0  0  Tired, decreased energy 0  0  0  Change in appetite 0  0  1  Feeling bad or failure about yourself  0  0  0  Trouble concentrating 0  0  0  Moving slowly or fidgety/restless 0  0  0  Suicidal thoughts 0  0  0  PHQ-9 Score 0  0  1  Difficult doing work/chores Not difficult at all        phq 9 is negative   Fall Risk:    12/12/2023    9:46 AM 10/14/2023    7:47 AM 06/10/2023   10:19 AM 03/20/2023   11:35 AM 11/15/2022    7:39 AM  Fall Risk   Falls in the past year? 0 0 0 0 0  Number falls in past yr: 0 0 0 0 0  Injury with Fall? 0 0 0 0 0  Risk for fall due to : No Fall Risks No Fall Risks No Fall Risks No Fall Risks No Fall Risks  Follow up Falls prevention discussed;Education provided;Falls evaluation completed Falls prevention discussed Falls prevention discussed Education provided;Falls prevention discussed Falls prevention discussed      Assessment & Plan  Assessment and Plan    Hypertension Elevated blood pressure readings at two consecutive visits (152/92 today). Patient has a history of white coat hypertension, but current readings suggest persistent hypertension. Patient is not currently on antihypertensive medication. -Start Valsartan 40mg  daily. -Recheck blood pressure in 1 month.  Constipation Severe constipation noted on CT scan. Patient reports frequent bowel movements, but this is likely due to overflow incontinence (soiling)  secondary to severe constipation. -Start Miralax 1 capful daily. -Encourage increased fiber intake.  Renal Lesions Two lesions noted on right kidney on CT scan. Ultrasound suggested possible complicated cysts. -Order repeat renal ultrasound in 6 months (June 2025).  Uterine Fibroids Identified on pelvic ultrasound. No current symptoms. -No intervention needed at this time.  Mild Cognitive Dysfunction Patient is currently on Aricept. No new behavioral issues reported. -Continue Aricept.  Dyslipidemia Patient is currently on Atorvastatin 40mg  daily. -Continue Atorvastatin 40mg  daily.  Age-Related Osteoporosis Patient is currently on Fosamax. -Continue Fosamax.  Hypothyroidism No current symptoms. Blood pressure is elevated. -Continue current thyroid medication. -Check thyroid levels.  General Health Maintenance / Followup Plans -Flu shot administered today. -Next bone density scan due in 2025. -Follow-up appointment in 1 month to check blood pressure and response to Valsartan. -Follow-up renal ultrasound in June 2025.

## 2023-12-12 ENCOUNTER — Ambulatory Visit (INDEPENDENT_AMBULATORY_CARE_PROVIDER_SITE_OTHER): Payer: 59 | Admitting: Family Medicine

## 2023-12-12 ENCOUNTER — Encounter: Payer: Self-pay | Admitting: Family Medicine

## 2023-12-12 VITALS — BP 164/86 | HR 73 | Temp 98.0°F | Resp 16 | Ht 64.0 in | Wt 115.5 lb

## 2023-12-12 DIAGNOSIS — D219 Benign neoplasm of connective and other soft tissue, unspecified: Secondary | ICD-10-CM | POA: Diagnosis not present

## 2023-12-12 DIAGNOSIS — N281 Cyst of kidney, acquired: Secondary | ICD-10-CM | POA: Diagnosis not present

## 2023-12-12 DIAGNOSIS — K5900 Constipation, unspecified: Secondary | ICD-10-CM

## 2023-12-12 DIAGNOSIS — I10 Essential (primary) hypertension: Secondary | ICD-10-CM

## 2023-12-12 DIAGNOSIS — E039 Hypothyroidism, unspecified: Secondary | ICD-10-CM

## 2023-12-12 DIAGNOSIS — G3184 Mild cognitive impairment, so stated: Secondary | ICD-10-CM

## 2023-12-12 DIAGNOSIS — Z23 Encounter for immunization: Secondary | ICD-10-CM

## 2023-12-12 DIAGNOSIS — E785 Hyperlipidemia, unspecified: Secondary | ICD-10-CM | POA: Diagnosis not present

## 2023-12-12 DIAGNOSIS — M810 Age-related osteoporosis without current pathological fracture: Secondary | ICD-10-CM | POA: Diagnosis not present

## 2023-12-12 MED ORDER — DONEPEZIL HCL 10 MG PO TABS: 10.0000 mg | ORAL_TABLET | Freq: Every day | ORAL | 1 refills | Status: DC

## 2023-12-12 MED ORDER — VALSARTAN 40 MG PO TABS: 40.0000 mg | ORAL_TABLET | Freq: Every day | ORAL | 0 refills | Status: DC

## 2023-12-12 MED ORDER — POLYETHYLENE GLYCOL 3350 17 GM/SCOOP PO POWD: 17.0000 g | Freq: Two times a day (BID) | ORAL | 0 refills | Status: AC | PRN

## 2023-12-12 MED ORDER — ATORVASTATIN CALCIUM 40 MG PO TABS: ORAL_TABLET | ORAL | 1 refills | Status: DC

## 2023-12-13 LAB — TSH: TSH: 5.86 m[IU]/L — ABNORMAL HIGH (ref 0.40–4.50)

## 2023-12-16 ENCOUNTER — Telehealth: Payer: Self-pay | Admitting: Family Medicine

## 2023-12-16 NOTE — Telephone Encounter (Signed)
 Copied from CRM [id number]. Topic: General - Other >> Dec 15, 2023  4:34 PM Everette C wrote: Reason for CRM: The patient's daughter has returned a missed phone call from the practice  Please contact the patient further when possible

## 2023-12-16 NOTE — Telephone Encounter (Signed)
Daughter contacted on right dose

## 2024-01-12 ENCOUNTER — Encounter: Payer: Self-pay | Admitting: Family Medicine

## 2024-01-12 ENCOUNTER — Ambulatory Visit: Payer: 59

## 2024-01-12 VITALS — BP 172/90

## 2024-01-12 DIAGNOSIS — I10 Essential (primary) hypertension: Secondary | ICD-10-CM

## 2024-01-12 NOTE — Progress Notes (Signed)
Patient is in office today for a nurse visit for Blood Pressure Check. Patient blood pressure was 172/90, Patient No chest pain, No shortness of breath, No dyspnea on exertion, No orthopnea, No paroxysmal nocturnal dyspnea, No edema, No palpitations, No syncope. Pt claims she did pick up medication and has been taking it but unsure if took it this morning. Pt said she checked once at home, systolic number was 132 does not remember the diastolic.

## 2024-01-19 ENCOUNTER — Ambulatory Visit: Payer: 59

## 2024-01-19 VITALS — BP 148/82

## 2024-01-19 DIAGNOSIS — I10 Essential (primary) hypertension: Secondary | ICD-10-CM

## 2024-01-19 NOTE — Progress Notes (Signed)
Patient is in office today for a nurse visit for Blood Pressure Check. Patient blood pressure was 148/82, Patient No chest pain, No shortness of breath, No dyspnea on exertion, No orthopnea, No paroxysmal nocturnal dyspnea, No edema, No palpitations, No syncope. Pt has been compliant with medication since last nurse visit.

## 2024-01-20 ENCOUNTER — Other Ambulatory Visit: Payer: Self-pay | Admitting: Family Medicine

## 2024-01-20 ENCOUNTER — Encounter: Payer: Self-pay | Admitting: Family Medicine

## 2024-01-20 DIAGNOSIS — I10 Essential (primary) hypertension: Secondary | ICD-10-CM

## 2024-01-20 MED ORDER — VALSARTAN 40 MG PO TABS: 40.0000 mg | ORAL_TABLET | Freq: Every day | ORAL | 0 refills | Status: DC

## 2024-03-25 ENCOUNTER — Ambulatory Visit: Payer: 59

## 2024-03-25 VITALS — Ht 64.0 in | Wt 125.0 lb

## 2024-03-25 DIAGNOSIS — Z0000 Encounter for general adult medical examination without abnormal findings: Secondary | ICD-10-CM

## 2024-03-25 NOTE — Progress Notes (Signed)
 Because this visit was a virtual/telehealth visit,  certain criteria was not obtained, such a blood pressure, CBG if applicable, and timed get up and go. Any medications not marked as "taking" were not mentioned during the medication reconciliation part of the visit. Any vitals not documented were not able to be obtained due to this being a telehealth visit or patient was unable to self-report a recent blood pressure reading due to a lack of equipment at home via telehealth. Vitals that have been documented are verbally provided by the patient.   Subjective:   Lauren Eaton is a 78 y.o. who presents for a Medicare Wellness preventive visit.  Visit Complete: Virtual I connected with  Lauren Eaton on 03/25/24 by a audio enabled telemedicine application and verified that I am speaking with the correct person using two identifiers.  Patient Location: Home  Provider Location: Office/Clinic  I discussed the limitations of evaluation and management by telemedicine. The patient expressed understanding and agreed to proceed.  Vital Signs: Because this visit was a virtual/telehealth visit, some criteria may be missing or patient reported. Any vitals not documented were not able to be obtained and vitals that have been documented are patient reported.  VideoDeclined- This patient declined Librarian, academic. Therefore the visit was completed with audio only.  Persons Participating in Visit: Patient.  AWV Questionnaire: No: Patient Medicare AWV questionnaire was not completed prior to this visit.  Cardiac Risk Factors include: advanced age (>56men, >64 women);dyslipidemia     Objective:    Today's Vitals   03/25/24 1334  Weight: 125 lb (56.7 kg)  Height: 5\' 4"  (1.626 m)  PainSc: 0-No pain   Body mass index is 21.46 kg/m.     03/25/2024    1:37 PM 03/20/2023   11:43 AM 03/14/2022   11:48 AM 10/24/2020   10:29 AM 10/21/2019   10:13 AM 06/19/2017    8:44 AM  04/01/2017    9:20 PM  Advanced Directives  Does Patient Have a Medical Advance Directive? No No No No No No No  Would patient like information on creating a medical advance directive? No - Patient declined  No - Patient declined Yes (MAU/Ambulatory/Procedural Areas - Information given) Yes (MAU/Ambulatory/Procedural Areas - Information given)  No - Patient declined    Current Medications (verified) Outpatient Encounter Medications as of 03/25/2024  Medication Sig   alendronate (FOSAMAX) 70 MG tablet TAKE 1 TABLET(70 MG) BY MOUTH 1 TIME A WEEK WITH A FULL GLASS OF WATER AND ON AN EMPTY STOMACH   aspirin EC 81 MG tablet Take 1 tablet (81 mg total) by mouth daily.   atorvastatin (LIPITOR) 40 MG tablet TAKE 1 TABLET(40 MG) BY MOUTH DAILY   calcium-vitamin D (OSCAL-500) 500-400 MG-UNIT tablet Take 1 tablet by mouth daily.   donepezil (ARICEPT) 10 MG tablet Take 1 tablet (10 mg total) by mouth daily at 12 noon.   levothyroxine (SYNTHROID) 25 MCG tablet TAKE 1 TABLET BY MOUTH DAILY BEFORE BREAKFAST   loratadine (CLARITIN) 10 MG tablet Take 1 tablet (10 mg total) by mouth daily.   Multiple Vitamin (MULTI-VITAMINS) TABS Take 1 tablet by mouth daily.   polyethylene glycol powder (GLYCOLAX/MIRALAX) 17 GM/SCOOP powder Take 17 g by mouth 2 (two) times daily as needed.   valsartan (DIOVAN) 40 MG tablet Take 1 tablet (40 mg total) by mouth daily.   No facility-administered encounter medications on file as of 03/25/2024.    Allergies (verified) Patient has no known allergies.  History: Past Medical History:  Diagnosis Date   Abnormal finding on thyroid function test    recheck labs    Abnormal glucose    Chronic insomnia    Dyslipidemia    onset 07/20/10   History of anemia    Hyperlipidemia    Mild cognitive impairment    Osteoporosis    she took forteo in 2012, but has been off medication, bone density is still showing osteoporosis, refer to rheumatologist   Vitamin D deficiency    Past  Surgical History:  Procedure Laterality Date   BREAST BIOPSY Right 11/16/2019   rt mass stereo bx/ neg/ x clip   COLONOSCOPY WITH PROPOFOL N/A 04/30/2023   Procedure: COLONOSCOPY WITH PROPOFOL;  Surgeon: [name], MD;  Location[facility] ENDOSCOPY;  Service: Gastroenterology;  Laterality: N/A;   ESOPHAGOGASTRODUODENOSCOPY (EGD) WITH PROPOFOL N/A 04/30/2023   Procedure: ESOPHAGOGASTRODUODENOSCOPY (EGD) WITH PROPOFOL;  Surgeon: [name], MD;  Location: [facility] ENDOSCOPY;  Service: Gastroenterology;  Laterality: N/A;   SURGERY OF LIP     Family History  Problem Relation Age of Onset   Alzheimer's disease Mother    Dementia Mother    Early death Father        MVA   CVA Sister 61       complications of   Cancer Brother        stomach   Cancer Brother        lung   Breast cancer Neg Hx    Social History   Socioeconomic History   Marital status: Single    Spouse name: Not on file   Number of children: 1   Years of education: 8   Highest education level: Not on file  Occupational History   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 quit 10/2014.  Vaping Use   Vaping status: Never Used  Substance and Sexual Activity   Alcohol use: No    Alcohol/week: 0.0 standard drinks of alcohol   Drug use: No   Sexual activity: Not Currently  Other Topics Concern   Not on file  Social History Narrative   Lives alone in a senior citizen apartment, has friends and likes to take care of her plants and go out with her friend/neighbors.    Still able to drive - but only grocery stores and church.    Currently watching her great-grandson - infant   Caffeine use: Soda daily   Right handed   Social Drivers of Health   Financial Resource Strain: Low Risk  (03/25/2024)   Overall Financial Resource  Strain (CARDIA)    Difficulty of Paying Living Expenses: Not hard at all  Food Insecurity: No Food Insecurity (03/25/2024)   Hunger Vital Sign    Worried About Running Out of Food in the Last Year: Never true    Ran Out of Food in the Last Year: Never true  Transportation Needs: No Transportation Needs (03/25/2024)   PRAPARE - Administrator, Civil Service (Medical): No    Lack of Transportation (Non-Medical): No  Physical Activity: Sufficiently Active (03/25/2024)   Exercise Vital Sign    Days of Exercise per Week: 5 days    Minutes of Exercise per Session: 60 min  Stress: No Stress  Concern Present (03/25/2024)   [employer] of Occupational Health - Occupational Stress Questionnaire    Feeling of Stress : Not at all  Social Connections: Moderately Isolated (03/25/2024)   Social Connection and Isolation Panel [NHANES]    Frequency of Communication with Friends and Family: More than three times a week    Frequency of Social Gatherings with Friends and Family: More than three times a week    Attends Religious Services: More than 4 times per year    Active Member of [employer] or Organizations: No    Attends Banker Meetings: Never    Marital Status: Widowed    Tobacco Counseling Counseling given: Not Answered Tobacco comments: Used snuff, dips 3-4 dips/day, has used it 40-50 years. quit 10/2014.    Clinical Intake:  Pre-visit preparation completed: Yes  Pain : No/denies pain Pain Score: 0-No pain     BMI - recorded: 21.46 Nutritional Status: BMI of 19-24  Normal Nutritional Risks: None Diabetes: No  No results found for: "HGBA1C"   How often do you need to have someone help you when you read instructions, pamphlets, or other written materials from your doctor or pharmacy?: 1 - Never What is the last grade level you completed in school?: 8TH GRADE  Interpreter Needed?: No  Information entered by :: Gabriele Zwilling N. Holton Sidman, LPN.   Activities of  Daily Living     03/25/2024    1:38 PM 10/14/2023    7:48 AM  In your present state of health, do you have any difficulty performing the following activities:  Hearing? 0 0  Vision? 0 0  Difficulty concentrating or making decisions? 1 1  Walking or climbing stairs? 0 0  Dressing or bathing? 0 0  Doing errands, shopping? 0 1  Preparing Food and eating ? N   Using the Toilet? N   In the past six months, have you accidently leaked urine? N   Do you have problems with loss of bowel control? N   Managing your Medications? N   Managing your Finances? N   Housekeeping or managing your Housekeeping? N     Patient Care Team: [name], MD as PCP - General (Family Medicine) [place], [facility] as Consulting Physician (Optometry)  Indicate any recent Medical Services you may have received from other than [facility] providers in the past year (date may be approximate).     Assessment:   This is a routine wellness examination for Lauren Eaton.  Hearing/Vision screen Hearing Screening - Comments:: Denies hearing difficulties.  Vision Screening - Comments:: Wears rx glasses - up to date with routine eye exams with [facility]   Goals Addressed             This Visit's Progress    03/25/2024: To remin healthy and active.         Depression Screen     03/25/2024    1:39 PM 12/12/2023    9:52 AM 10/14/2023    7:48 AM 06/10/2023   10:19 AM 03/20/2023   11:37 AM 11/15/2022    7:39 AM 07/12/2022    9:18 AM  PHQ 2/9 Scores  PHQ - 2 Score 0 0 0 0 0 0 0  PHQ- 9 Score 1 0  0  1 0    Fall Risk     03/25/2024    1:36 PM 12/12/2023    9:46 AM 10/14/2023    7:47 AM 06/10/2023   10:19 AM 03/20/2023   11:35 AM  Fall Risk   Falls in the past year? 0 0 0 0 0  Number falls in past yr: 0 0 0 0 0  Injury with Fall? 0 0 0 0 0  Risk for fall due to : No Fall Risks No Fall Risks No Fall Risks No Fall Risks No Fall Risks  Follow up Falls prevention discussed;Falls evaluation completed Falls  prevention discussed;Education provided;Falls evaluation completed Falls prevention discussed Falls prevention discussed Education provided;Falls prevention discussed    MEDICARE RISK AT HOME:  Medicare Risk at Home Any stairs in or around the home?: Yes (LIVES ON 2ND FLOOR; USES ELEVATOR) If so, are there any without handrails?: No Home free of loose throw rugs in walkways, pet beds, electrical cords, etc?: Yes Adequate lighting in your home to reduce risk of falls?: Yes Life alert?: Yes Use of a cane, walker or w/c?: No Grab bars in the bathroom?: Yes Shower chair or bench in shower?: Yes Elevated toilet seat or a handicapped toilet?: Yes  TIMED UP AND GO:  Was the test performed?  No  Cognitive Function: 6CIT completed    03/25/2024    1:39 PM 04/27/2018    3:16 PM 02/16/2018    3:55 PM 08/09/2016   10:04 AM 08/09/2016   10:01 AM  MMSE - Mini Mental State Exam  Not completed: Unable to complete      Orientation to time  3 4  5   Orientation to Place  3 5  5   Registration  3 3  3   Attention/ Calculation  2 5  4   Recall  2 3  3   Language- name 2 objects  2 2  2   Language- repeat  1 1  1   Language- follow 3 step command  3 3  3   Language- read & follow direction  1 1  1   Write a sentence  1 1  1   Copy design  1 1 0 --  Copy design-comments     0  Total score  22 29          03/25/2024    1:40 PM 03/20/2023   11:45 AM 10/24/2020   10:30 AM 10/21/2019   10:16 AM  6CIT Screen  What Year? 0 points 0 points 0 points 0 points  What month? 0 points 0 points 0 points 0 points  What time? 0 points 0 points 0 points 0 points  Count back from 20 0 points 4 points 0 points 0 points  Months in reverse 4 points 4 points 4 points 4 points  Repeat phrase 0 points 10 points 2 points 4 points  Total Score 4 points 18 points 6 points 8 points    Immunizations Immunization History  Administered Date(s) Administered   Fluad Quad(high Dose 65+) 08/11/2019, 10/20/2020, 11/27/2021,  11/15/2022   Fluad Trivalent(High Dose 65+) 12/12/2023   Influenza, High Dose Seasonal PF 08/11/2017, 09/22/2018   Influenza,inj,Quad PF,6+ Mos 07/31/2015   Influenza-Unspecified 11/11/2016   Moderna Sars-Covid-2 Vaccination 02/03/2020, 03/02/2020, 11/07/2020, 01/01/2021, 04/03/2021   Pneumococcal Conjugate (Pcv15) 10/06/2014   Pneumococcal Conjugate-13 01/27/2017   Pneumococcal Polysaccharide-23 07/31/2015   Td 07/26/2009   Zoster Recombinant(Shingrix) 08/21/2017, 10/25/2017   Zoster, Live 07/31/2015    Screening Tests Health Maintenance  Topic Date Due   COVID-19 Vaccine (6 - 2024-25 season) 08/17/2023   DTaP/Tdap/Td (2 - Tdap) 12/11/2024 (Originally 07/27/2019)   INFLUENZA VACCINE  07/16/2024   MAMMOGRAM  09/17/2024   Medicare Annual Wellness (AWV)  03/25/2025   Pneumonia  Vaccine 63+ Years old  Completed   DEXA SCAN  Completed   Hepatitis C Screening  Completed   Zoster Vaccines- Shingrix  Completed   HPV VACCINES  Aged Out   Meningococcal B Vaccine  Aged Out   Colonoscopy  Discontinued    Health Maintenance  Health Maintenance Due  Topic Date Due   COVID-19 Vaccine (6 - 2024-25 season) 08/17/2023   Health Maintenance Items Addressed: Yes   Additional Screening:  Vision Screening: Recommended annual ophthalmology exams for early detection of glaucoma and other disorders of the eye.  Dental Screening: Recommended annual dental exams for proper oral hygiene  Community Resource Referral / Chronic Care Management: CRR required this visit?  No   CCM required this visit?  No     Plan:     I have personally reviewed and noted the following in the patient's chart:   Medical and social history Use of alcohol, tobacco or illicit drugs  Current medications and supplements including opioid prescriptions. Patient is not currently taking opioid prescriptions. Functional ability and status Nutritional status Physical activity Advanced directives List of other  physicians Hospitalizations, surgeries, and ER visits in previous 12 months Vitals Screenings to include cognitive, depression, and falls Referrals and appointments  In addition, I have reviewed and discussed with patient certain preventive protocols, quality metrics, and best practice recommendations. A written personalized care plan for preventive services as well as general preventive health recommendations were provided to patient.     Mickeal Needy, LPN   1/61/0960   After Visit Summary: (MyChart) Due to this being a telephonic visit, the after visit summary with patients personalized plan was offered to patient via MyChart   Notes: Nothing significant to report at this time.

## 2024-03-25 NOTE — Patient Instructions (Addendum)
 Ms. Underdown , Thank you for taking time to come for your Medicare Wellness Visit. I appreciate your ongoing commitment to your health goals. Please review the following plan we discussed and let me know if I can assist you in the future.   Referrals/Orders/Follow-Ups/Clinician Recommendations: Keep maintaining your health by keeping your appointments with Dr. Carlynn Purl and any specialists that you may see.  Call us if you need anything.  Have a great year!!!!  This is a list of the screening recommended for you and due dates:  Health Maintenance  Topic Date Due   COVID-19 Vaccine (6 - 2024-25 season) 08/17/2023   DTaP/Tdap/Td vaccine (2 - Tdap) 12/11/2024*   Flu Shot  07/16/2024   Mammogram  09/17/2024   Medicare Annual Wellness Visit  03/25/2025   Pneumonia Vaccine  Completed   DEXA scan (bone density measurement)  Completed   Hepatitis C Screening  Completed   Zoster (Shingles) Vaccine  Completed   HPV Vaccine  Aged Out   Meningitis B Vaccine  Aged Out   Colon Cancer Screening  Discontinued  *Topic was postponed. The date shown is not the original due date.    Advanced directives: (Declined) Advance directive discussed with you today. Even though you declined this today, please call our office should you change your mind, and we can give you the proper paperwork for you to fill out.  Next Medicare Annual Wellness Visit scheduled for next year: Yes

## 2024-05-10 ENCOUNTER — Encounter: Payer: Self-pay | Admitting: Family Medicine

## 2024-05-11 ENCOUNTER — Other Ambulatory Visit: Payer: Self-pay

## 2024-05-11 DIAGNOSIS — E785 Hyperlipidemia, unspecified: Secondary | ICD-10-CM

## 2024-05-11 DIAGNOSIS — M810 Age-related osteoporosis without current pathological fracture: Secondary | ICD-10-CM

## 2024-05-11 DIAGNOSIS — E039 Hypothyroidism, unspecified: Secondary | ICD-10-CM

## 2024-05-11 DIAGNOSIS — G3184 Mild cognitive impairment, so stated: Secondary | ICD-10-CM

## 2024-05-11 DIAGNOSIS — I10 Essential (primary) hypertension: Secondary | ICD-10-CM

## 2024-05-11 MED ORDER — VALSARTAN 40 MG PO TABS: 40.0000 mg | ORAL_TABLET | Freq: Every day | ORAL | 0 refills | Status: DC

## 2024-05-11 MED ORDER — DONEPEZIL HCL 10 MG PO TABS: 10.0000 mg | ORAL_TABLET | Freq: Every day | ORAL | 1 refills | Status: DC

## 2024-05-11 MED ORDER — LEVOTHYROXINE SODIUM 25 MCG PO TABS: ORAL_TABLET | ORAL | 1 refills | Status: AC

## 2024-05-11 MED ORDER — ATORVASTATIN CALCIUM 40 MG PO TABS: ORAL_TABLET | ORAL | 1 refills | Status: AC

## 2024-05-11 MED ORDER — ALENDRONATE SODIUM 70 MG PO TABS: ORAL_TABLET | ORAL | 3 refills | Status: AC

## 2024-06-11 ENCOUNTER — Ambulatory Visit: Admission: RE | Admit: 2024-06-11 | Payer: 59 | Source: Ambulatory Visit

## 2024-06-21 ENCOUNTER — Ambulatory Visit: Payer: Self-pay | Admitting: Family Medicine

## 2024-06-23 ENCOUNTER — Ambulatory Visit: Admitting: Family Medicine

## 2024-06-28 ENCOUNTER — Ambulatory Visit (INDEPENDENT_AMBULATORY_CARE_PROVIDER_SITE_OTHER): Admitting: Family Medicine

## 2024-06-28 ENCOUNTER — Encounter: Payer: Self-pay | Admitting: Family Medicine

## 2024-06-28 VITALS — BP 146/84 | HR 65 | Resp 16 | Ht 64.0 in | Wt 127.0 lb

## 2024-06-28 DIAGNOSIS — I10 Essential (primary) hypertension: Secondary | ICD-10-CM

## 2024-06-28 DIAGNOSIS — R202 Paresthesia of skin: Secondary | ICD-10-CM

## 2024-06-28 DIAGNOSIS — E039 Hypothyroidism, unspecified: Secondary | ICD-10-CM | POA: Diagnosis not present

## 2024-06-28 DIAGNOSIS — E785 Hyperlipidemia, unspecified: Secondary | ICD-10-CM

## 2024-06-28 DIAGNOSIS — G3184 Mild cognitive impairment, so stated: Secondary | ICD-10-CM

## 2024-06-28 DIAGNOSIS — N281 Cyst of kidney, acquired: Secondary | ICD-10-CM

## 2024-06-28 DIAGNOSIS — Z1231 Encounter for screening mammogram for malignant neoplasm of breast: Secondary | ICD-10-CM

## 2024-06-28 DIAGNOSIS — M810 Age-related osteoporosis without current pathological fracture: Secondary | ICD-10-CM

## 2024-06-28 LAB — COMPREHENSIVE METABOLIC PANEL WITH GFR
AG Ratio: 1.4 (calc) (ref 1.0–2.5)
ALT: 17 U/L (ref 6–29)
AST: 28 U/L (ref 10–35)
Albumin: 4.3 g/dL (ref 3.6–5.1)
Alkaline phosphatase (APISO): 66 U/L (ref 37–153)
BUN: 15 mg/dL (ref 7–25)
CO2: 28 mmol/L (ref 20–32)
Calcium: 9.7 mg/dL (ref 8.6–10.4)
Chloride: 106 mmol/L (ref 98–110)
Creat: 0.71 mg/dL (ref 0.60–1.00)
Globulin: 3 g/dL (ref 1.9–3.7)
Glucose, Bld: 93 mg/dL (ref 65–99)
Potassium: 4.8 mmol/L (ref 3.5–5.3)
Sodium: 143 mmol/L (ref 135–146)
Total Bilirubin: 0.9 mg/dL (ref 0.2–1.2)
Total Protein: 7.3 g/dL (ref 6.1–8.1)
eGFR: 88 mL/min/1.73m2 (ref >=60)

## 2024-06-28 LAB — CBC WITH DIFFERENTIAL/PLATELET
Absolute Lymphocytes: 1493 {cells}/uL (ref 850–3900)
Absolute Monocytes: 330 {cells}/uL (ref 200–950)
Basophils Absolute: 30 {cells}/uL (ref 0–200)
Basophils Relative: 0.5 %
Eosinophils Absolute: 100 {cells}/uL (ref 15–500)
Eosinophils Relative: 1.7 %
HCT: 39.1 % (ref 35.0–45.0)
Hemoglobin: 12.7 g/dL (ref 11.7–15.5)
MCH: 30.9 pg (ref 27.0–33.0)
MCHC: 32.5 g/dL (ref 32.0–36.0)
MCV: 95.1 fL (ref 80.0–100.0)
MPV: 9.8 fL (ref 7.5–12.5)
Monocytes Relative: 5.6 %
Neutro Abs: 3947 {cells}/uL (ref 1500–7800)
Neutrophils Relative %: 66.9 %
Platelets: 269 Thousand/uL (ref 140–400)
RBC: 4.11 Million/uL (ref 3.80–5.10)
RDW: 12.7 % (ref 11.0–15.0)
Total Lymphocyte: 25.3 %
WBC: 5.9 Thousand/uL (ref 3.8–10.8)

## 2024-06-28 LAB — B12 AND FOLATE PANEL
Folate: >24 ng/mL
Vitamin B-12: 885 pg/mL (ref 200–1100)

## 2024-06-28 LAB — LIPID PANEL
Cholesterol: 156 mg/dL (ref <200)
HDL: 74 mg/dL (ref >=50)
LDL Cholesterol (Calc): 68 mg/dL
Non-HDL Cholesterol (Calc): 82 mg/dL (ref <130)
Total CHOL/HDL Ratio: 2.1 (calc) (ref <5.0)
Triglycerides: 63 mg/dL (ref <150)

## 2024-06-28 LAB — VITAMIN D 25 HYDROXY (VIT D DEFICIENCY, FRACTURES): Vit D, 25-Hydroxy: 62 ng/mL (ref 30–100)

## 2024-06-28 LAB — TSH: TSH: 3.76 m[IU]/L (ref 0.40–4.50)

## 2024-06-28 MED ORDER — VALSARTAN 40 MG PO TABS: 40.0000 mg | ORAL_TABLET | Freq: Every day | ORAL | 1 refills | Status: DC

## 2024-06-28 MED ORDER — BLOOD PRESSURE MONITOR AUTOMAT DEVI: 1.0000 | Freq: Every day | 0 refills | Status: AC

## 2024-06-28 NOTE — Progress Notes (Signed)
 Name: Lauren Eaton   MRN: [id number]    DOB: [birth year]   Date:06/28/2024       Progress Note  Subjective  Chief Complaint  Chief Complaint  Patient presents with   Medical Management of Chronic Issues   Discussed the use of AI scribe software for clinical note transcription with the patient, who gave verbal consent to proceed.  History of Present Illness Lauren Eaton is a 78 year old female with hypertension and hypothyroidism who presents for blood pressure management and medication review.  Her blood pressure today is 144/86 mmHg, with a previous reading of 148/82 mmHg. She takes valsartan  daily for hypertension but has only a few pills left and no refills available. She does not have a blood pressure monitor at home.  She has hypothyroidism and takes levothyroxine  25 micrograms daily. Her last thyroid  function test was performed in December.  She experiences itching on both feet, particularly around the ankles, without any rash. The itching intensifies with scratching. There is no burning sensation.  Her past medical history includes age-related osteoporosis, for which she takes alendronate  once a week.   She also takes atorvastatin  40 mg daily for dyslipidemia and reports no muscle aches.  She has a history of renal cysts on the right kidney, with a follow-up ultrasound due now. She also has a history of fibroid tumors and a mass on the right kidney, previously identified as a cyst.  She experiences mild cognitive impairment, affecting her ability to manage bills, and her caregiver assists with this. She is still able to drive and perform daily activities independently.    Patient Active Problem List   Diagnosis Date Noted   Hypothyroidism, adult 07/12/2022   Behavioral change 08/09/2016   Memory changes 08/09/2016   Coccyalgia 07/29/2015   Abnormal thyroid  function test 07/20/2010   Dyslipidemia 07/20/2010   OP (osteoporosis) 07/26/2009   History of anemia 07/20/2009     Past Surgical History:  Procedure Laterality Date   BREAST BIOPSY Right 11/16/2019   rt mass stereo bx/ neg/ x clip   COLONOSCOPY WITH PROPOFOL  N/A 04/30/2023   Procedure: COLONOSCOPY WITH PROPOFOL ;  Surgeon: Therisa Bi, MD;  Location: [facility] ENDOSCOPY;  Service: Gastroenterology;  Laterality: N/A;   ESOPHAGOGASTRODUODENOSCOPY (EGD) WITH PROPOFOL  N/A 04/30/2023   Procedure: ESOPHAGOGASTRODUODENOSCOPY (EGD) WITH PROPOFOL ;  Surgeon: Therisa Bi, MD;  Location: [facility] ENDOSCOPY;  Service: Gastroenterology;  Laterality: N/A;   SURGERY OF LIP      Family History  Problem Relation Age of Onset   Alzheimer's disease Mother    Dementia Mother    Early death Father        MVA   CVA Sister 58       complications of   Cancer Brother        stomach   Cancer Brother        lung   Breast cancer Neg Hx     Social History   Tobacco Use   Smoking status: Never   Smokeless tobacco: Former    Types: Snuff   Tobacco comments:    Used snuff, dips 3-4 dips/day, has used it 40-50 years. quit 10/2014.  Substance Use Topics   Alcohol use: No    Alcohol/week: 0.0 standard drinks of alcohol     Current Outpatient Medications:    alendronate  (FOSAMAX ) 70 MG tablet, TAKE 1 TABLET(70 MG) BY MOUTH 1 TIME A WEEK WITH A FULL GLASS OF WATER AND ON AN EMPTY STOMACH, Disp: 12 tablet, Rfl: 3  aspirin  EC 81 MG tablet, Take 1 tablet (81 mg total) by mouth daily., Disp: 30 tablet, Rfl: 0   atorvastatin  (LIPITOR) 40 MG tablet, TAKE 1 TABLET(40 MG) BY MOUTH DAILY, Disp: 90 tablet, Rfl: 1   Blood Pressure Monitoring (BLOOD PRESSURE MONITOR AUTOMAT) DEVI, 1 each by Does not apply route daily., Disp: 1 each, Rfl: 0   calcium -vitamin D  (OSCAL-500) 500-400 MG-UNIT tablet, Take 1 tablet by mouth daily., Disp: , Rfl:    donepezil  (ARICEPT ) 10 MG tablet, Take 1 tablet (10 mg total) by mouth daily at 12 noon., Disp: 90 tablet, Rfl: 1   levothyroxine  (SYNTHROID ) 25 MCG tablet, TAKE 1 TABLET BY MOUTH DAILY BEFORE  BREAKFAST, Disp: 90 tablet, Rfl: 1   Multiple Vitamin (MULTI-VITAMINS) TABS, Take 1 tablet by mouth daily., Disp: , Rfl:    polyethylene glycol powder (GLYCOLAX /MIRALAX ) 17 GM/SCOOP powder, Take 17 g by mouth 2 (two) times daily as needed., Disp: 3350 g, Rfl: 0   loratadine  (CLARITIN ) 10 MG tablet, Take 1 tablet (10 mg total) by mouth daily. (Patient not taking: Reported on 06/28/2024), Disp: 30 tablet, Rfl: 11   valsartan  (DIOVAN ) 40 MG tablet, Take 1 tablet (40 mg total) by mouth daily., Disp: 90 tablet, Rfl: 1  No Known Allergies  I personally reviewed active problem list, medication list, allergies with the patient/caregiver today.   ROS  Ten systems reviewed and is negative except as mentioned in HPI    Objective Physical Exam  CONSTITUTIONAL: Patient appears well-developed and well-nourished. No distress. HEENT: Head atraumatic, normocephalic, neck supple. CARDIOVASCULAR: Normal rate, regular rhythm and normal heart sounds. No murmur heard. No BLE edema. No ankle swelling. PULMONARY: Effort normal and breath sounds normal. No respiratory distress. ABDOMINAL: There is no tenderness or distention. MUSCULOSKELETAL: Normal gait. Without gross motor or sensory deficit. PSYCHIATRIC: Patient has a normal mood and affect. Behavior is normal. Judgment and thought content normal. SKIN: Toes appear normal.  Vitals:   06/28/24 0855 06/28/24 0926 06/28/24 0951  BP: (!) 144/86 (!) 152/84 (!) 146/84  Pulse: 65    Resp: 16    SpO2: 99%    Weight: 127 lb (57.6 kg)    Height: 5' 4 (1.626 m)      Body mass index is 21.8 kg/m.    PHQ2/9:    06/28/2024    8:49 AM 03/25/2024    1:39 PM 12/12/2023    9:52 AM 10/14/2023    7:48 AM 06/10/2023   10:19 AM  Depression screen PHQ 2/9  Decreased Interest 0 0 0 0 0  Down, Depressed, Hopeless 0 0 0 0 0  PHQ - 2 Score 0 0 0 0 0  Altered sleeping  0 0  0  Tired, decreased energy  0 0  0  Change in appetite  0 0  0  Feeling bad or failure  about yourself   0 0  0  Trouble concentrating  1 0  0  Moving slowly or fidgety/restless  0 0  0  Suicidal thoughts  0 0  0  PHQ-9 Score  1 0  0  Difficult doing work/chores  Not difficult at all Not difficult at all      phq 9 is negative  Fall Risk:    06/28/2024    8:49 AM 03/25/2024    1:36 PM 12/12/2023    9:46 AM 10/14/2023    7:47 AM 06/10/2023   10:19 AM  Fall Risk   Falls in the past year? 0 0  0 0 0  Number falls in past yr: 0 0 0 0 0  Injury with Fall? 0 0 0 0 0  Risk for fall due to : No Fall Risks No Fall Risks No Fall Risks No Fall Risks No Fall Risks  Follow up Falls evaluation completed Falls prevention discussed;Falls evaluation completed Falls prevention discussed;Education provided;Falls evaluation completed Falls prevention discussed Falls prevention discussed     Assessment & Plan Hypertension with white coat effect Blood pressure 144/86, suspect white coat hypertension. Compliance with valsartan  uncertain due to cognitive impairment. Home monitoring needed to differentiate true hypertension. - Refilled valsartan  for six months. - Instructed caregiver to use pill box for compliance. - Recommended wrist blood pressure monitor. - Instructed caregiver to record home blood pressure readings. - Plan to adjust valsartan  if home readings consistently over 140/90.  Hypothyroidism On levothyroxine  25 mcg daily. Last TSH high, indicating suboptimal control. Compliance may be an issue. - Ordered TSH blood test. - Ensured daily levothyroxine  intake using pill box.  Mild cognitive impairment Affects medication compliance and bill management. Daily activities intact. - Monitor cognitive function. - Assist with bill management.  Age-related osteoporosis On alendronate . No fractures. Bone density scan due in October. Vitamin D  needed. - Scheduled bone density scan in October. - Ensured weekly alendronate  intake with post-dose precautions. - Added vitamin D   supplement.  Dyslipidemia On atorvastatin  40 mg daily. Compliance uncertain due to cognitive impairment. - Continue atorvastatin  40 mg daily.  Foot pruritus, possible neuropathy Itching on feet without rash. Possible neuropathy considered. - Ordered B12 blood test.  Right renal cysts, under surveillance Benign right renal cysts. Follow-up ultrasound needed. - Ensure renal ultrasound is scheduled and completed.

## 2024-06-29 ENCOUNTER — Ambulatory Visit: Payer: Self-pay | Admitting: Family Medicine

## 2024-07-01 ENCOUNTER — Ambulatory Visit
Admission: RE | Admit: 2024-07-01 | Discharge: 2024-07-01 | Disposition: A | Discharge status: Home / Self Care | Source: Ambulatory Visit | Attending: Family Medicine | Admitting: Family Medicine

## 2024-07-01 DIAGNOSIS — N281 Cyst of kidney, acquired: Secondary | ICD-10-CM | POA: Diagnosis present

## 2024-07-07 ENCOUNTER — Encounter: Payer: Self-pay | Admitting: Family Medicine

## 2024-09-22 ENCOUNTER — Other Ambulatory Visit

## 2024-09-22 ENCOUNTER — Encounter

## 2024-10-07 ENCOUNTER — Ambulatory Visit
Admission: RE | Admit: 2024-10-07 | Discharge: 2024-10-07 | Disposition: A | Discharge status: Home / Self Care | Source: Ambulatory Visit | Attending: Family Medicine | Admitting: Family Medicine

## 2024-10-07 DIAGNOSIS — M810 Age-related osteoporosis without current pathological fracture: Secondary | ICD-10-CM | POA: Diagnosis present

## 2024-12-29 ENCOUNTER — Ambulatory Visit: Admitting: Family Medicine

## 2025-01-04 ENCOUNTER — Other Ambulatory Visit: Payer: Self-pay | Admitting: Family Medicine

## 2025-01-04 DIAGNOSIS — I10 Essential (primary) hypertension: Secondary | ICD-10-CM

## 2025-01-04 DIAGNOSIS — G3184 Mild cognitive impairment, so stated: Secondary | ICD-10-CM

## 2025-01-04 NOTE — Telephone Encounter (Signed)
 Requested medications are due for refill today.  yes  Requested medications are on the active medications list.  yes  Last refill. 06/28/2024 #90 1 rf  Future visit scheduled.   yes  Notes to clinic.  Labs are expired.    Requested Prescriptions  Pending Prescriptions Disp Refills   valsartan  (DIOVAN ) 40 MG tablet [Pharmacy Med Name: Valsartan  40 MG Oral Tablet] 90 tablet 0    Sig: Take 1 tablet by mouth once daily     Cardiovascular:  Angiotensin Receptor Blockers Failed - 01/04/2025  3:09 PM      Failed - Cr in normal range and within 180 days    Creat  Date Value Ref Range Status  06/28/2024 0.71 0.60 - 1.00 mg/dL Final   Creatinine, Urine  Date Value Ref Range Status  08/11/2019 66 20 - 275 mg/dL Final         Failed - K in normal range and within 180 days    Potassium  Date Value Ref Range Status  06/28/2024 4.8 3.5 - 5.3 mmol/L Final  04/22/2014 3.2 (L) 3.5 - 5.1 mmol/L Final         Failed - Last BP in normal range    BP Readings from Last 1 Encounters:  06/28/24 (!) 146/84         Failed - Valid encounter within last 6 months    Recent Outpatient Visits           6 months ago Hypothyroidism, adult   [facility] Glenard Mire, MD              Passed - Patient is not pregnant      Signed Prescriptions Disp Refills   donepezil  (ARICEPT ) 10 MG tablet 90 tablet 0    Sig: TAKE 1 TABLET BY MOUTH ONCE DAILY AT 12 NOON     Neurology:  Alzheimer's Agents Failed - 01/04/2025  3:09 PM      Failed - Valid encounter within last 6 months    Recent Outpatient Visits           6 months ago Hypothyroidism, adult   [facility] Glenard Mire, MD

## 2025-01-04 NOTE — Telephone Encounter (Signed)
 Requested Prescriptions  Pending Prescriptions Disp Refills   donepezil  (ARICEPT ) 10 MG tablet [Pharmacy Med Name: Donepezil  HCl 10 MG Oral Tablet] 90 tablet 0    Sig: TAKE 1 TABLET BY MOUTH ONCE DAILY AT 12 NOON     Neurology:  Alzheimer's Agents Failed - 01/04/2025  3:08 PM      Failed - Valid encounter within last 6 months    Recent Outpatient Visits           6 months ago Hypothyroidism, adult   [facility] Glenard Mire, MD               valsartan  (DIOVAN ) 40 MG tablet [Pharmacy Med Name: Valsartan  40 MG Oral Tablet] 90 tablet 0    Sig: Take 1 tablet by mouth once daily     Cardiovascular:  Angiotensin Receptor Blockers Failed - 01/04/2025  3:08 PM      Failed - Cr in normal range and within 180 days    Creat  Date Value Ref Range Status  06/28/2024 0.71 0.60 - 1.00 mg/dL Final   Creatinine, Urine  Date Value Ref Range Status  08/11/2019 66 20 - 275 mg/dL Final         Failed - K in normal range and within 180 days    Potassium  Date Value Ref Range Status  06/28/2024 4.8 3.5 - 5.3 mmol/L Final  04/22/2014 3.2 (L) 3.5 - 5.1 mmol/L Final         Failed - Last BP in normal range    BP Readings from Last 1 Encounters:  06/28/24 (!) 146/84         Failed - Valid encounter within last 6 months    Recent Outpatient Visits           6 months ago Hypothyroidism, adult   [facility] Sowles, Krichna, MD              Passed - Patient is not pregnant

## 2025-01-13 ENCOUNTER — Other Ambulatory Visit: Payer: Self-pay | Admitting: Family Medicine

## 2025-01-13 DIAGNOSIS — I10 Essential (primary) hypertension: Secondary | ICD-10-CM

## 2025-01-13 NOTE — Telephone Encounter (Signed)
 Rx 01/05/24 #30 - too soon Requested Prescriptions  Pending Prescriptions Disp Refills   valsartan  (DIOVAN ) 40 MG tablet [Pharmacy Med Name: Valsartan  40 MG Oral Tablet] 90 tablet 0    Sig: Take 1 tablet by mouth once daily     Cardiovascular:  Angiotensin Receptor Blockers Failed - 01/13/2025  4:25 PM      Failed - Cr in normal range and within 180 days    Creat  Date Value Ref Range Status  06/28/2024 0.71 0.60 - 1.00 mg/dL Final   Creatinine, Urine  Date Value Ref Range Status  08/11/2019 66 20 - 275 mg/dL Final         Failed - K in normal range and within 180 days    Potassium  Date Value Ref Range Status  06/28/2024 4.8 3.5 - 5.3 mmol/L Final  04/22/2014 3.2 (L) 3.5 - 5.1 mmol/L Final         Failed - Last BP in normal range    BP Readings from Last 1 Encounters:  06/28/24 (!) 146/84         Failed - Valid encounter within last 6 months    Recent Outpatient Visits           6 months ago Hypothyroidism, adult   [facility] Sowles, Krichna, MD              Passed - Patient is not pregnant

## 2025-01-20 ENCOUNTER — Ambulatory Visit: Admitting: Family Medicine

## 2025-01-27 ENCOUNTER — Ambulatory Visit: Admitting: Family Medicine

## 2025-03-28 ENCOUNTER — Ambulatory Visit

## 2025-04-01 ENCOUNTER — Ambulatory Visit
